# Patient Record
Sex: Female | Born: 1980 | Race: White | Hispanic: Yes | Marital: Married | Smoking: Former smoker
Health system: Southern US, Community
[De-identification: ages and names within clinical notes are randomized; demographics above are authoritative.]

## PROBLEM LIST (undated history)

## (undated) DIAGNOSIS — N939 Abnormal uterine and vaginal bleeding, unspecified: Secondary | ICD-10-CM

## (undated) DIAGNOSIS — M1990 Unspecified osteoarthritis, unspecified site: Secondary | ICD-10-CM

## (undated) DIAGNOSIS — K219 Gastro-esophageal reflux disease without esophagitis: Secondary | ICD-10-CM

## (undated) DIAGNOSIS — Z973 Presence of spectacles and contact lenses: Secondary | ICD-10-CM

---

## 2001-01-24 ENCOUNTER — Encounter: Payer: Self-pay | Admitting: Obstetrics

## 2001-01-24 ENCOUNTER — Inpatient Hospital Stay (HOSPITAL_COMMUNITY): Admission: AD | Admit: 2001-01-24 | Discharge: 2001-01-24 | Payer: Self-pay | Admitting: Obstetrics

## 2001-03-05 ENCOUNTER — Inpatient Hospital Stay (HOSPITAL_COMMUNITY): Admission: AD | Admit: 2001-03-05 | Discharge: 2001-03-05 | Payer: Self-pay | Admitting: Obstetrics

## 2001-05-28 ENCOUNTER — Inpatient Hospital Stay (HOSPITAL_COMMUNITY): Admission: AD | Admit: 2001-05-28 | Discharge: 2001-05-28 | Payer: Self-pay | Admitting: *Deleted

## 2001-05-28 ENCOUNTER — Encounter: Payer: Self-pay | Admitting: *Deleted

## 2001-07-30 ENCOUNTER — Inpatient Hospital Stay (HOSPITAL_COMMUNITY): Admission: AD | Admit: 2001-07-30 | Discharge: 2001-07-30 | Payer: Self-pay | Admitting: *Deleted

## 2001-09-13 ENCOUNTER — Inpatient Hospital Stay (HOSPITAL_COMMUNITY): Admission: AD | Admit: 2001-09-13 | Discharge: 2001-09-13 | Payer: Self-pay | Admitting: *Deleted

## 2001-09-14 ENCOUNTER — Inpatient Hospital Stay (HOSPITAL_COMMUNITY): Admission: AD | Admit: 2001-09-14 | Discharge: 2001-09-16 | Payer: Self-pay | Admitting: Obstetrics and Gynecology

## 2001-09-14 ENCOUNTER — Encounter (INDEPENDENT_AMBULATORY_CARE_PROVIDER_SITE_OTHER): Payer: Self-pay

## 2002-03-25 ENCOUNTER — Inpatient Hospital Stay (HOSPITAL_COMMUNITY): Admission: AD | Admit: 2002-03-25 | Discharge: 2002-03-25 | Payer: Self-pay | Admitting: *Deleted

## 2002-03-25 ENCOUNTER — Emergency Department (HOSPITAL_COMMUNITY): Admission: EM | Admit: 2002-03-25 | Discharge: 2002-03-25 | Payer: Self-pay | Admitting: Emergency Medicine

## 2002-03-28 ENCOUNTER — Inpatient Hospital Stay (HOSPITAL_COMMUNITY): Admission: AD | Admit: 2002-03-28 | Discharge: 2002-03-28 | Payer: Self-pay | Admitting: *Deleted

## 2002-05-10 ENCOUNTER — Inpatient Hospital Stay (HOSPITAL_COMMUNITY): Admission: AD | Admit: 2002-05-10 | Discharge: 2002-05-10 | Payer: Self-pay | Admitting: Family Medicine

## 2002-05-10 ENCOUNTER — Encounter: Payer: Self-pay | Admitting: Family Medicine

## 2002-05-23 ENCOUNTER — Encounter: Admission: RE | Admit: 2002-05-23 | Discharge: 2002-05-23 | Payer: Self-pay | Admitting: *Deleted

## 2002-05-23 ENCOUNTER — Other Ambulatory Visit: Admission: RE | Admit: 2002-05-23 | Discharge: 2002-05-23 | Payer: Self-pay | Admitting: Obstetrics and Gynecology

## 2002-09-17 ENCOUNTER — Inpatient Hospital Stay (HOSPITAL_COMMUNITY): Admission: AD | Admit: 2002-09-17 | Discharge: 2002-09-17 | Payer: Self-pay | Admitting: *Deleted

## 2002-11-25 ENCOUNTER — Inpatient Hospital Stay (HOSPITAL_COMMUNITY): Admission: AD | Admit: 2002-11-25 | Discharge: 2002-11-26 | Payer: Self-pay | Admitting: Obstetrics & Gynecology

## 2002-11-25 ENCOUNTER — Encounter: Payer: Self-pay | Admitting: Obstetrics & Gynecology

## 2003-03-19 ENCOUNTER — Inpatient Hospital Stay (HOSPITAL_COMMUNITY): Admission: AD | Admit: 2003-03-19 | Discharge: 2003-03-20 | Payer: Self-pay | Admitting: Obstetrics and Gynecology

## 2003-03-23 ENCOUNTER — Inpatient Hospital Stay (HOSPITAL_COMMUNITY): Admission: AD | Admit: 2003-03-23 | Discharge: 2003-03-24 | Payer: Self-pay | Admitting: *Deleted

## 2003-03-24 ENCOUNTER — Inpatient Hospital Stay (HOSPITAL_COMMUNITY): Admission: AD | Admit: 2003-03-24 | Discharge: 2003-03-26 | Payer: Self-pay | Admitting: Obstetrics and Gynecology

## 2004-04-21 ENCOUNTER — Emergency Department (HOSPITAL_COMMUNITY): Admission: EM | Admit: 2004-04-21 | Discharge: 2004-04-21 | Payer: Self-pay | Admitting: Family Medicine

## 2004-04-22 ENCOUNTER — Emergency Department (HOSPITAL_COMMUNITY): Admission: EM | Admit: 2004-04-22 | Discharge: 2004-04-23 | Payer: Self-pay | Admitting: Emergency Medicine

## 2004-04-23 ENCOUNTER — Inpatient Hospital Stay (HOSPITAL_COMMUNITY): Admission: AD | Admit: 2004-04-23 | Discharge: 2004-04-23 | Payer: Self-pay | Admitting: Obstetrics & Gynecology

## 2004-06-14 ENCOUNTER — Inpatient Hospital Stay (HOSPITAL_COMMUNITY): Admission: AD | Admit: 2004-06-14 | Discharge: 2004-06-14 | Payer: Self-pay | Admitting: Obstetrics and Gynecology

## 2004-06-16 ENCOUNTER — Emergency Department (HOSPITAL_COMMUNITY): Admission: EM | Admit: 2004-06-16 | Discharge: 2004-06-16 | Payer: Self-pay | Admitting: Emergency Medicine

## 2004-06-18 ENCOUNTER — Ambulatory Visit (HOSPITAL_COMMUNITY): Admission: AD | Admit: 2004-06-18 | Discharge: 2004-06-18 | Payer: Self-pay | Admitting: Obstetrics and Gynecology

## 2004-06-18 ENCOUNTER — Encounter (INDEPENDENT_AMBULATORY_CARE_PROVIDER_SITE_OTHER): Payer: Self-pay | Admitting: *Deleted

## 2005-03-04 ENCOUNTER — Inpatient Hospital Stay (HOSPITAL_COMMUNITY): Admission: AD | Admit: 2005-03-04 | Discharge: 2005-03-04 | Payer: Self-pay | Admitting: *Deleted

## 2005-04-20 ENCOUNTER — Inpatient Hospital Stay (HOSPITAL_COMMUNITY): Admission: AD | Admit: 2005-04-20 | Discharge: 2005-04-21 | Payer: Self-pay | Admitting: Obstetrics & Gynecology

## 2005-04-20 ENCOUNTER — Ambulatory Visit: Payer: Self-pay | Admitting: Obstetrics and Gynecology

## 2005-05-31 ENCOUNTER — Encounter: Payer: Self-pay | Admitting: Obstetrics & Gynecology

## 2005-05-31 ENCOUNTER — Ambulatory Visit: Payer: Self-pay | Admitting: Family Medicine

## 2005-06-16 ENCOUNTER — Ambulatory Visit: Payer: Self-pay | Admitting: Obstetrics and Gynecology

## 2005-06-16 ENCOUNTER — Inpatient Hospital Stay (HOSPITAL_COMMUNITY): Admission: AD | Admit: 2005-06-16 | Discharge: 2005-06-16 | Payer: Self-pay | Admitting: Obstetrics & Gynecology

## 2005-06-23 ENCOUNTER — Ambulatory Visit: Payer: Self-pay | Admitting: Certified Nurse Midwife

## 2005-06-23 ENCOUNTER — Inpatient Hospital Stay (HOSPITAL_COMMUNITY): Admission: AD | Admit: 2005-06-23 | Discharge: 2005-06-25 | Payer: Self-pay | Admitting: Gynecology

## 2006-02-18 ENCOUNTER — Emergency Department (HOSPITAL_COMMUNITY): Admission: EM | Admit: 2006-02-18 | Discharge: 2006-02-18 | Payer: Self-pay | Admitting: Family Medicine

## 2006-02-22 IMAGING — US US OB COMP +14 WK
1 series · 13 of 28 positions shown · non-contrast
Comparison: none

CLINICAL DATA: 23 week 4 day gestational age by LMP.  Right-sided abdominal pain and pressure.

[Series 1: us ob comp +14 wk · 0.29mm/px · 13 of 93 slices shown]
[im 4/93]
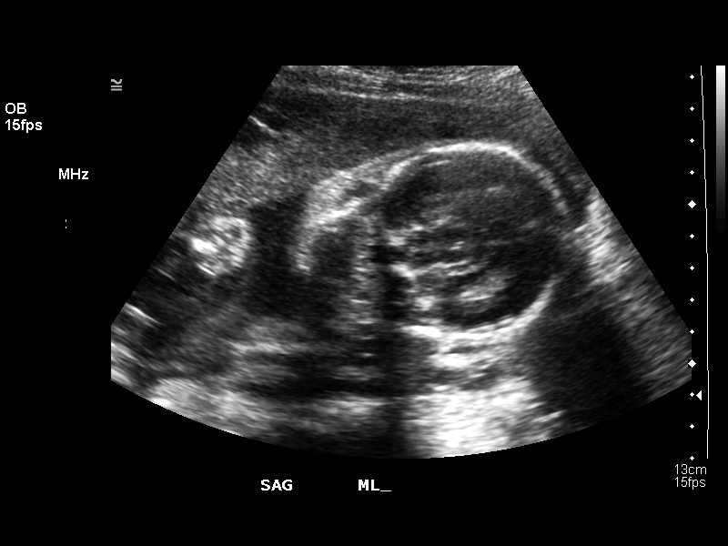
[im 11/93]
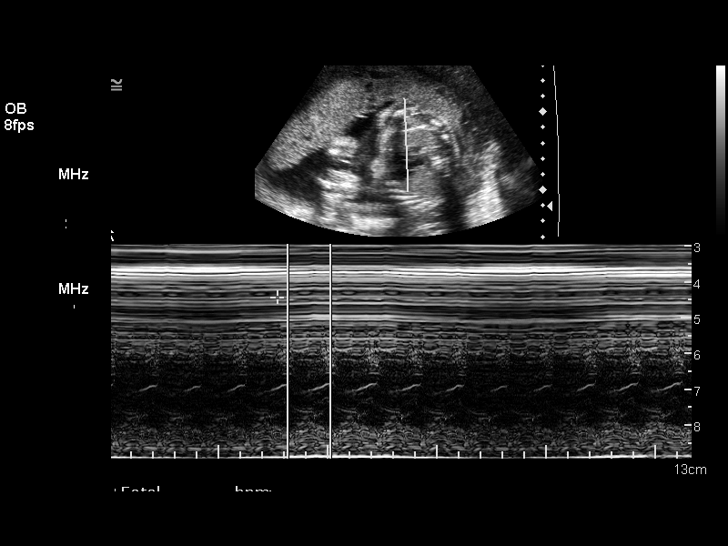
[im 18/93]
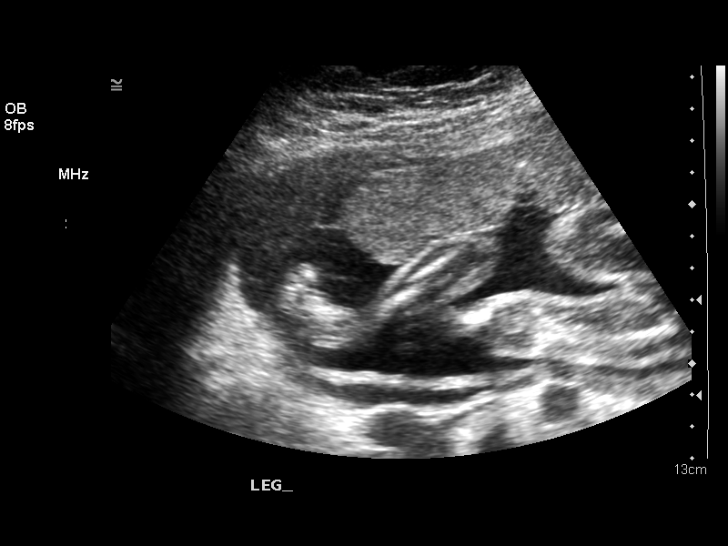
[im 24/93]
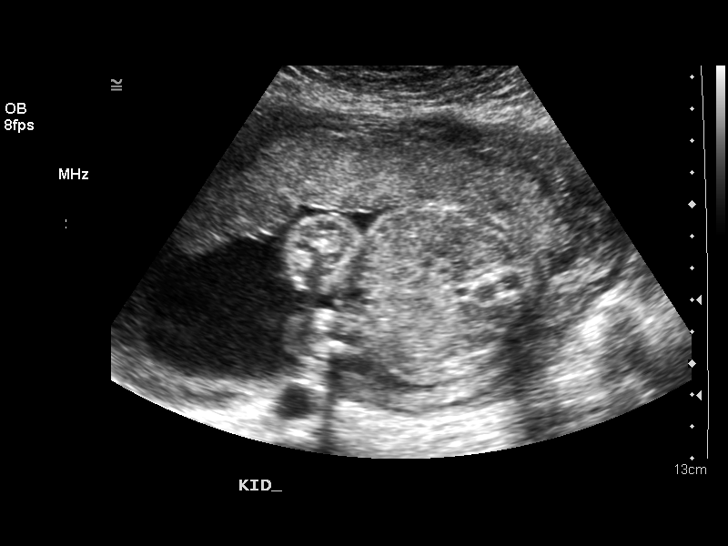
[im 31/93]
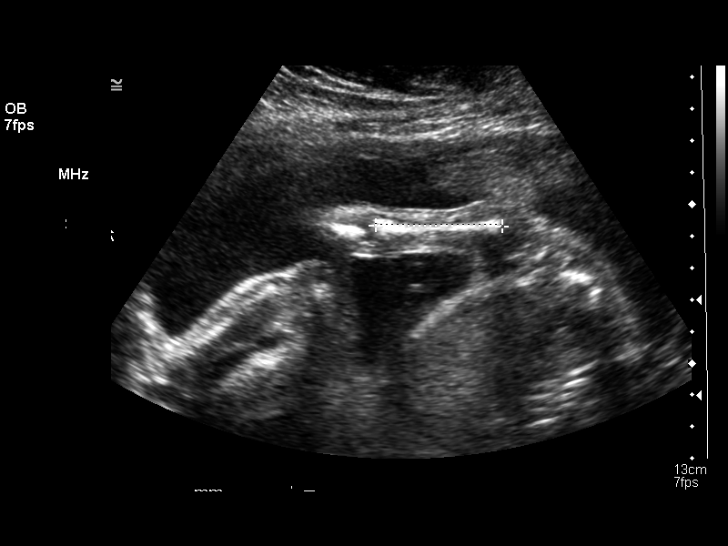
[im 38/93]
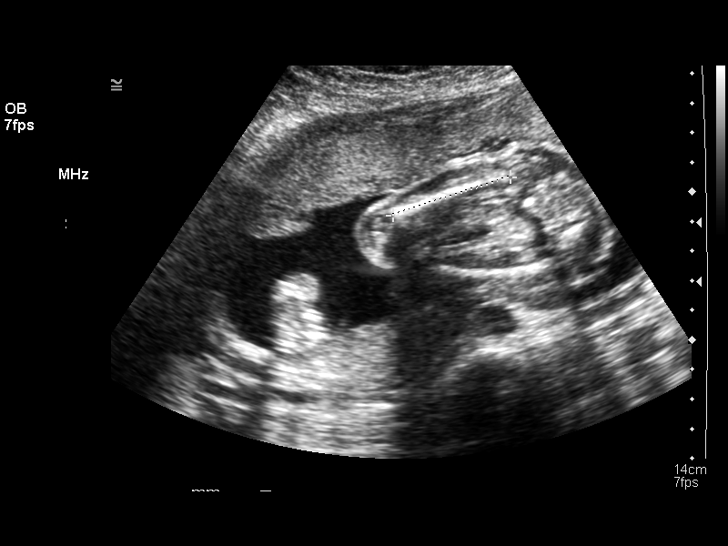
[im 48/93]
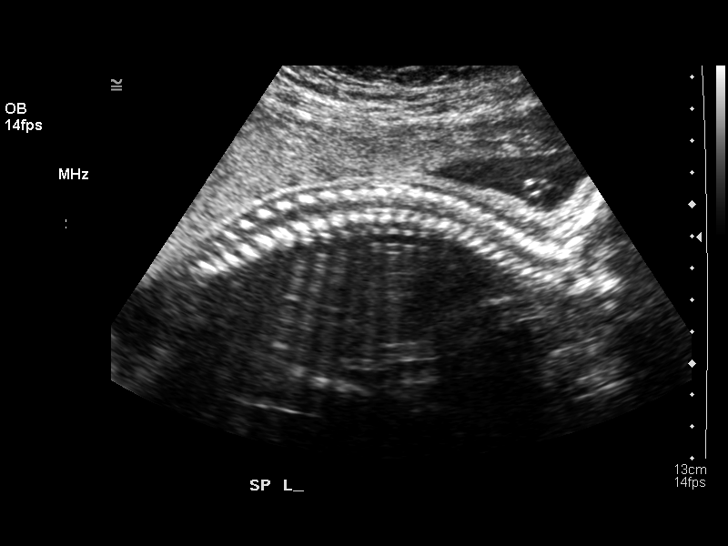
[im 55/93]
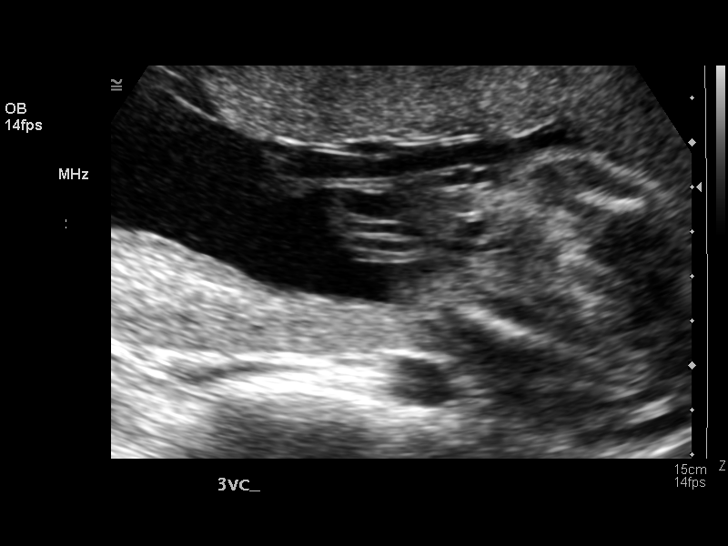
[im 62/93]
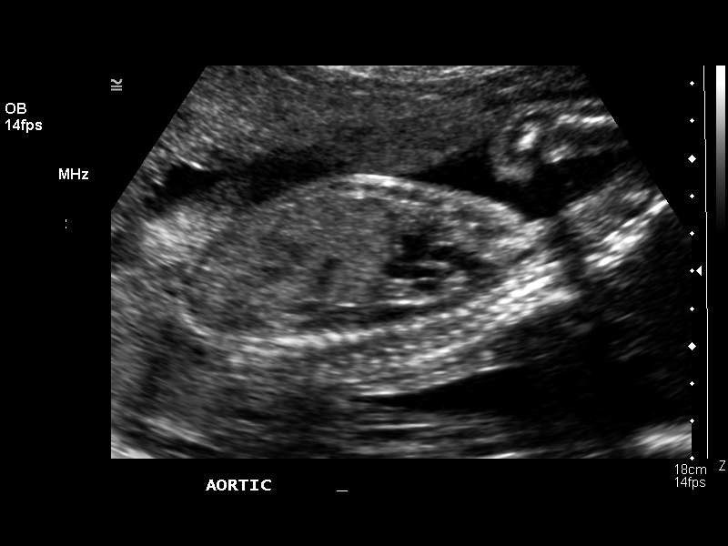
[im 69/93]
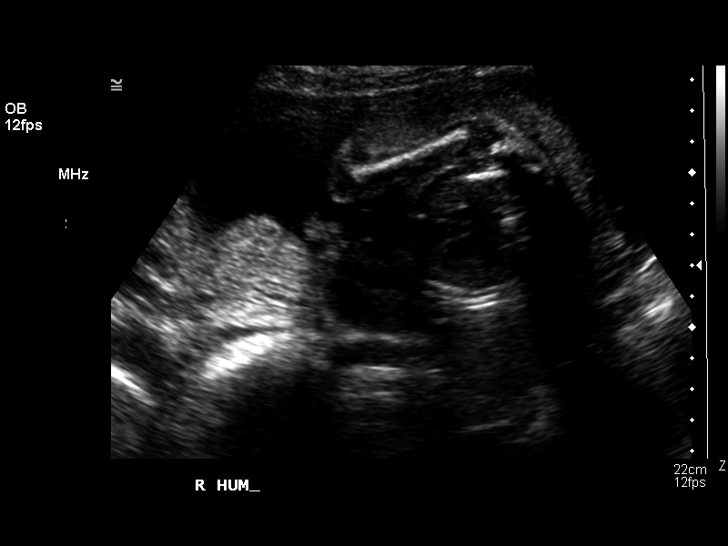
[im 75/93]
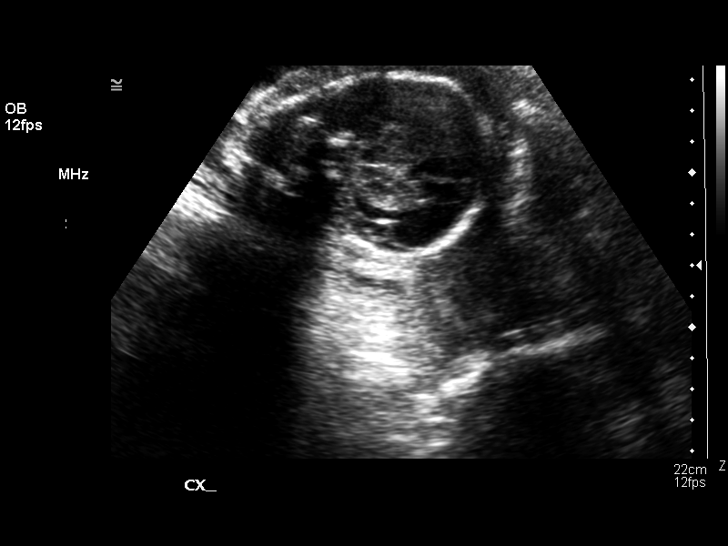
[im 82/93]
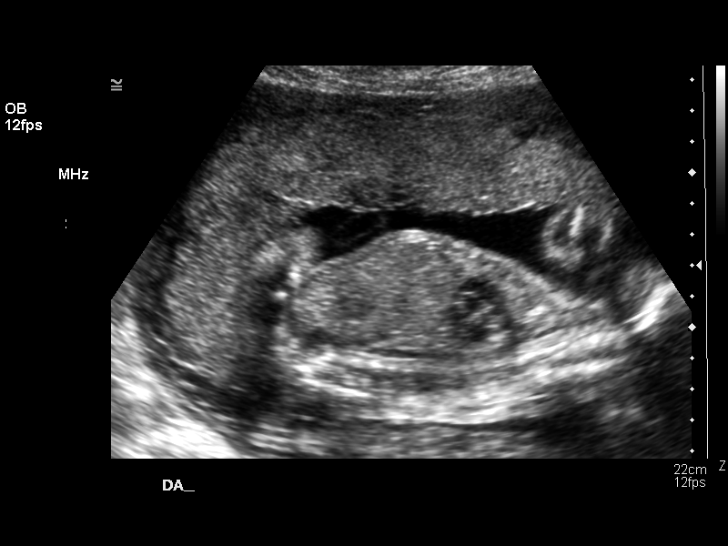
[im 89/93]
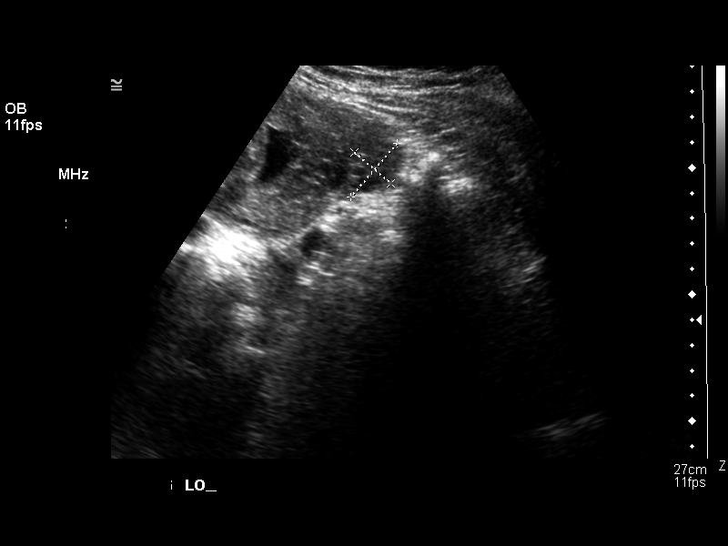

[13 of 28 positions shown; findings below may reference images not displayed]

OBSTETRICAL ULTRASOUND:
Number of Fetuses:  1
Heart Rate:  133
Movement:  Yes
Breathing:  No  
Presentation:  Cephalic
Placental Location:  Anterior
Grade:  I
Previa:  No
Amniotic Fluid (Subjective):  Normal
Amniotic Fluid (Objective):   3.8 cm Vertical pocket 

FETAL BIOMETRY
BPD:  6.1 cm   24 w 6 d
HC:  22.8 cm  24 w 6 d
AC:  18.9 cm   23 w 5 d
FL:    4.4 cm   24 w 2 d

MEAN GA:  24 w 3 d  

FETAL ANATOMY
Lateral Ventricles:    Visualized 
Thalami/CSP:      Visualized 
Posterior Fossa:  Visualized 
Nuchal Region:    N/A
Spine:      Visualized 
4 Chamber Heart on Left:      Visualized 
Stomach on Left:      Visualized 
3 Vessel Cord:    Visualized 
Cord Insertion site:    Visualized 
Kidneys:  Visualized 
Bladder:  Visualized 
Extremities:      Visualized 

ADDITIONAL ANATOMY VISUALIZED:  LVOT, RVOT, upper lip, orbits, profile, diaphragm, heel, ductal arch, and aortic arch.

MATERNAL UTERINE AND ADNEXAL FINDINGS
Cervix: 3.3 cm Transabdominally.  Both ovaries are visualized and are unremarkable in appearance.
IMPRESSION: 1.  Single living intrauterine fetus with mean gestational age of 24 weeks and 3 days and sonographic EDC of 06/21/05.  This is within one week of LMP dating.
2.  No evidence of fetal anatomic abnormality.  
3.  No maternal uterine or adnexal abnormality identified.

## 2006-03-25 ENCOUNTER — Inpatient Hospital Stay (HOSPITAL_COMMUNITY): Admission: AD | Admit: 2006-03-25 | Discharge: 2006-03-25 | Payer: Self-pay | Admitting: Family Medicine

## 2006-12-21 ENCOUNTER — Ambulatory Visit: Payer: Self-pay | Admitting: Obstetrics and Gynecology

## 2006-12-21 ENCOUNTER — Inpatient Hospital Stay (HOSPITAL_COMMUNITY): Admission: AD | Admit: 2006-12-21 | Discharge: 2006-12-21 | Payer: Self-pay | Admitting: Obstetrics and Gynecology

## 2007-01-03 ENCOUNTER — Ambulatory Visit: Payer: Self-pay | Admitting: Obstetrics & Gynecology

## 2007-01-03 ENCOUNTER — Encounter: Payer: Self-pay | Admitting: Physician Assistant

## 2007-01-17 ENCOUNTER — Ambulatory Visit: Payer: Self-pay | Admitting: *Deleted

## 2007-01-31 ENCOUNTER — Ambulatory Visit: Payer: Self-pay | Admitting: *Deleted

## 2007-02-07 ENCOUNTER — Ambulatory Visit: Payer: Self-pay | Admitting: Obstetrics & Gynecology

## 2007-02-14 ENCOUNTER — Ambulatory Visit: Payer: Self-pay | Admitting: Obstetrics & Gynecology

## 2007-02-21 ENCOUNTER — Ambulatory Visit: Payer: Self-pay | Admitting: *Deleted

## 2007-02-28 ENCOUNTER — Ambulatory Visit: Payer: Self-pay | Admitting: *Deleted

## 2007-03-05 ENCOUNTER — Ambulatory Visit: Payer: Self-pay | Admitting: Obstetrics and Gynecology

## 2007-03-07 ENCOUNTER — Ambulatory Visit: Payer: Self-pay | Admitting: Obstetrics & Gynecology

## 2007-03-09 ENCOUNTER — Ambulatory Visit: Payer: Self-pay | Admitting: Physician Assistant

## 2007-03-09 ENCOUNTER — Inpatient Hospital Stay (HOSPITAL_COMMUNITY): Admission: RE | Admit: 2007-03-09 | Discharge: 2007-03-11 | Payer: Self-pay | Admitting: Family Medicine

## 2007-10-23 ENCOUNTER — Emergency Department (HOSPITAL_COMMUNITY): Admission: EM | Admit: 2007-10-23 | Discharge: 2007-10-23 | Payer: Self-pay | Admitting: Family Medicine

## 2008-04-04 ENCOUNTER — Emergency Department (HOSPITAL_COMMUNITY): Admission: EM | Admit: 2008-04-04 | Discharge: 2008-04-04 | Payer: Self-pay | Admitting: Emergency Medicine

## 2008-04-20 ENCOUNTER — Emergency Department (HOSPITAL_COMMUNITY): Admission: EM | Admit: 2008-04-20 | Discharge: 2008-04-20 | Payer: Self-pay | Admitting: Emergency Medicine

## 2010-09-03 NOTE — H&P (Signed)
NAME:  Marissa [name], SOLAR NO.:  [id number]   MEDICAL RECORD NO.:  [id number]          PATIENT TYPE:  MAT   LOCATION:  [facility]                          FACILITY:  [facility]   PHYSICIAN:  Charles A. Delcambre, MDDATE OF BIRTH:  February 18, 1981   DATE OF ADMISSION:  06/14/2004  DATE OF DISCHARGE:                                HISTORY & PHYSICAL   HISTORY OF PRESENT ILLNESS:  This patient to be admitted on June 18, 2004 to  undergo D&E for a 9-week fetal demise. She presented with LMP ? December  2005 with ultrasound on June 14, 2004 showing 9-week 1-day by verbal  report fetal demise, quantitative hCG 1606, Rh positive pregnancy. She is a  30 year old gravida 3 para 2-0-0-2. She has light vaginal bleeding at this  time, seen today in further follow-up.   PAST MEDICAL HISTORY:  None.   SURGICAL HISTORY:  None. SVD x2.   MEDICATIONS:  None.   ALLERGIES:  No known drug allergies.   SOCIAL HISTORY:  No tobacco, ethanol, or drug use. The patient is married in  a monogamous relationship with her husband.   FAMILY HISTORY:  Mother with asthma; otherwise, no significant medical  illnesses. Two siblings otherwise alive and well.   REVIEW OF SYSTEMS:  No chest pain, fever, chills, nausea, vomiting,  diarrhea, constipation, urgency, frequency, dysuria. Some cramping, light  vaginal bleeding at this time today. No passage of tissue, heavy cramping,  or heavy bleeding.   PHYSICAL EXAMINATION:  GENERAL:  Alert and oriented x3.  VITAL SIGNS:  Blood pressure 110/70, heart rate 58, respirations 16, weight  164 pounds.  HEENT:  Grossly within normal limits.  NECK:  Supple without thyromegaly or adenopathy.  LUNGS:  Clear bilaterally.  HEART:  Regular rate and rhythm without murmur, rub, or gallop.  BREASTS:  Symmetrical. Otherwise, exam deferred.  ABDOMEN:  Soft, flat, and nontender. No hepatosplenomegaly or masses noted.  PELVIC:  Normal external female genitalia. Vault with a small  amount of dark  blood present. Multiparous cervix. No cervical motion tenderness. Uterus 8-  10 weeks size, mid plane, nontender. Adnexa nontender without masses  bilaterally.   ASSESSMENT:  Nine-week fetal demise.   PLAN:  Offered expectant management versus D&E. I feel this is too far for  Cytotec, that I am not comfortable with Cytotec management at this point.  The patient elects D&E. She accepts risks of infection, bleeding, bowel and  bladder damage, uterine perforation risks, and blood product risks including  hepatitis and HIV exposure, risk of second D&E for retained products. All  questions were answered, she gives informed consent. N.p.o. past midnight,  preoperative CBC. We will proceed as outlined this Friday.      CAD/MEDQ  D:  06/15/2004  T:  06/15/2004  Job:  [id number]

## 2010-09-03 NOTE — Op Note (Signed)
NAME:  Marissa [name], Marissa [name] NO.:  [id number]   MEDICAL RECORD NO.:  [id number]          PATIENT TYPE:  EMS   LOCATION:  [facility]                         FACILITY:  [facility]   PHYSICIAN:  Charles A. Delcambre, MDDATE OF BIRTH:  October 02, 1980   DATE OF PROCEDURE:  06/18/2004  DATE OF DISCHARGE:                                 OPERATIVE REPORT   PREOPERATIVE DIAGNOSIS:  Nine week inevitable abortion/incomplete abortion.   POSTOPERATIVE DIAGNOSIS:  Nine week inevitable abortion/incomplete abortion.   PROCEDURES:  1.  Dilation and evacuation.  2.  Paracervical block.   SURGEON:  Charles A. Delcambre, MD   ASSISTANT:  None.   COMPLICATIONS:  None.   ESTIMATED BLOOD LOSS:  100 mL with the procedure itself.   ANESTHESIA:  Monitored anesthesia care with IV sedation.   SPECIMENS:  Products of conception to pathology.   DESCRIPTION OF PROCEDURE:  The patient was taken to the operating room and  placed in the supine position.  Anesthesia and sedation was then undertaken.  She was then placed in the dorsal lithotomy position and sterile prep and  drape was undertaken.  A specimen was placed in the vagina.  A single-tooth  tenaculum was placed on the anterior lip of the cervix.  A paracervical  block with 0.25% plain Marcaine was placed at 4 and 8 o'clock.  Twenty  milliliters was divided of 0.25% plain Marcaine equally and no evidence of  intravascular location was noted.  Ring forceps were used to go through the  open cervix.  Some tissue was removed with the ring forceps, almost to an  intact sac.  This was done, gently teasing the tissue through the os.  After  removal of this tissue, a banjo curette was then used to curette the small  amount of tissue, a suction curette 9 mm curved was used set on 50 mmHg and  no further tissue was aspirated.  There was no uterine perforation with  gentle banjo curetting further, and the procedure was terminated after  Pitocin 10 IV was given,  Methergine 0.2 mg IM was given one, and 600 mcg of  Cytotec was given per rectum.  This achieved adequate hemostasis.  The  patient was taken to recovery with the physician in attendance after all  instruments were removed, having tolerated the procedure well.    CAD/MEDQ  D:  06/18/2004  T:  06/18/2004  Job:  [id number]

## 2011-01-25 LAB — POCT URINALYSIS DIP (DEVICE)
Bilirubin Urine: NEGATIVE
Glucose, UA: NEGATIVE
Glucose, UA: NEGATIVE
Hgb urine dipstick: NEGATIVE
Hgb urine dipstick: NEGATIVE
Hgb urine dipstick: NEGATIVE
Ketones, ur: NEGATIVE
Nitrite: NEGATIVE
Operator id: 148111
Protein, ur: NEGATIVE
Protein, ur: NEGATIVE
Urobilinogen, UA: 0.2
pH: 6.5
pH: 7

## 2011-01-25 LAB — CBC
MCV: 81.4
Platelets: 212
RBC: 4.09
WBC: 5.9

## 2011-01-25 LAB — RPR: RPR Ser Ql: NONREACTIVE

## 2011-01-25 LAB — CCBB MATERNAL DONOR DRAW

## 2011-01-26 LAB — POCT URINALYSIS DIP (DEVICE)
Bilirubin Urine: NEGATIVE
Glucose, UA: 100 — AB
Glucose, UA: NEGATIVE
Glucose, UA: NEGATIVE
Hgb urine dipstick: NEGATIVE
Ketones, ur: NEGATIVE
Nitrite: NEGATIVE
Operator id: 148111
Protein, ur: NEGATIVE
Urobilinogen, UA: 2 — ABNORMAL HIGH
pH: 7
pH: 8.5 — ABNORMAL HIGH

## 2011-01-27 LAB — POCT URINALYSIS DIP (DEVICE)
Ketones, ur: NEGATIVE
Nitrite: NEGATIVE
Protein, ur: NEGATIVE
Specific Gravity, Urine: >=1.03
pH: 6

## 2011-01-28 LAB — URINALYSIS, ROUTINE W REFLEX MICROSCOPIC
Nitrite: NEGATIVE
Specific Gravity, Urine: <1.005 — ABNORMAL LOW
pH: 6.5

## 2011-01-28 LAB — DIFFERENTIAL
Basophils Relative: 0
Eosinophils Relative: 1
Neutrophils Relative %: 49

## 2011-01-28 LAB — GC/CHLAMYDIA PROBE AMP, GENITAL
Chlamydia, DNA Probe: NEGATIVE
GC Probe Amp, Genital: NEGATIVE

## 2011-01-28 LAB — WET PREP, GENITAL
Trich, Wet Prep: NONE SEEN
Yeast Wet Prep HPF POC: NONE SEEN

## 2011-01-28 LAB — RPR: RPR Ser Ql: NONREACTIVE

## 2011-01-28 LAB — CBC
HCT: 33.5 — ABNORMAL LOW
Platelets: 256
RDW: 14
WBC: 8.3

## 2011-04-19 HISTORY — PX: STERILIZATION: SHX533

## 2013-05-02 ENCOUNTER — Ambulatory Visit: Payer: Self-pay

## 2013-05-13 ENCOUNTER — Ambulatory Visit: Payer: No Typology Code available for payment source | Attending: Internal Medicine

## 2013-06-24 ENCOUNTER — Ambulatory Visit: Payer: Self-pay

## 2013-08-15 ENCOUNTER — Ambulatory Visit: Payer: Self-pay

## 2013-09-24 ENCOUNTER — Ambulatory Visit (INDEPENDENT_AMBULATORY_CARE_PROVIDER_SITE_OTHER): Payer: No Typology Code available for payment source | Admitting: Family Medicine

## 2013-09-24 ENCOUNTER — Encounter: Payer: Self-pay | Admitting: Family Medicine

## 2013-09-24 VITALS — BP 133/75 | HR 60 | Temp 98.2°F | Resp 16 | Ht 62.0 in | Wt 177.0 lb

## 2013-09-24 DIAGNOSIS — N912 Amenorrhea, unspecified: Secondary | ICD-10-CM

## 2013-09-24 DIAGNOSIS — R635 Abnormal weight gain: Secondary | ICD-10-CM

## 2013-09-24 DIAGNOSIS — R1032 Left lower quadrant pain: Secondary | ICD-10-CM

## 2013-09-24 DIAGNOSIS — K5900 Constipation, unspecified: Secondary | ICD-10-CM

## 2013-09-24 LAB — COMPLETE METABOLIC PANEL WITH GFR
ALBUMIN: 4.3 g/dL (ref 3.5–5.2)
ALK PHOS: 57 U/L (ref 39–117)
ALT: 22 U/L (ref 0–35)
AST: 21 U/L (ref 0–37)
BUN: 8 mg/dL (ref 6–23)
CALCIUM: 9.5 mg/dL (ref 8.4–10.5)
CO2: 27 mEq/L (ref 19–32)
Chloride: 102 mEq/L (ref 96–112)
Creat: 0.57 mg/dL (ref 0.50–1.10)
GFR, Est African American: >89 mL/min
GFR, Est Non African American: >89 mL/min
Glucose, Bld: 86 mg/dL (ref 70–99)
Potassium: 4.1 mEq/L (ref 3.5–5.3)
SODIUM: 137 meq/L (ref 135–145)
TOTAL PROTEIN: 7.8 g/dL (ref 6.0–8.3)
Total Bilirubin: 0.4 mg/dL (ref 0.2–1.2)

## 2013-09-24 LAB — CBC WITH DIFFERENTIAL/PLATELET
BASOS ABS: 0.1 10*3/uL (ref 0.0–0.1)
Basophils Relative: 1 % (ref 0–1)
Eosinophils Absolute: 0.2 10*3/uL (ref 0.0–0.7)
Eosinophils Relative: 3 % (ref 0–5)
HCT: 39.1 % (ref 36.0–46.0)
HEMOGLOBIN: 13.2 g/dL (ref 12.0–15.0)
Lymphocytes Relative: 52 % — ABNORMAL HIGH (ref 12–46)
Lymphs Abs: 3.1 10*3/uL (ref 0.7–4.0)
MCH: 27 pg (ref 26.0–34.0)
MCHC: 33.8 g/dL (ref 30.0–36.0)
MCV: 80 fL (ref 78.0–100.0)
Monocytes Absolute: 0.4 10*3/uL (ref 0.1–1.0)
Monocytes Relative: 7 % (ref 3–12)
NEUTROS ABS: 2.2 10*3/uL (ref 1.7–7.7)
NEUTROS PCT: 37 % — AB (ref 43–77)
PLATELETS: 295 10*3/uL (ref 150–400)
RBC: 4.89 MIL/uL (ref 3.87–5.11)
RDW: 14.4 % (ref 11.5–15.5)
WBC: 6 10*3/uL (ref 4.0–10.5)

## 2013-09-24 NOTE — Patient Instructions (Signed)
Will notify patient with laboratory results.  Start 1800 lowfat/low calorie diet divided over 6 small meals Increase water intake to 6-8 glasses per day Patient to start exercise regimen 3 days per week for 30 minutes.  Given written materials in spanish pertaining to low calorie diet.

## 2013-09-24 NOTE — Progress Notes (Signed)
Subjective:    Patient ID: Marissa [name], female    DOB: [birth year], 33 y.o.   MRN: [id number]  HPI  Patient presents to establish care accompanied by spanish interpreter. Patient primarily speaks spanish, yet understands some English per interpreter.  Reports that she has not had a primary physician since relocating from [place]. States that her  last physical examination was in 2013 in [place].   Patient complaining of pain to to left lower abdomen. States that pain started 3 months ago. She thought that the pain was related to the start of her menstrual period. However, her menstrual period never came. She maintains that her cycles were regular prior to 3 months ago. She states that she had a tubal ligation in 2013, so she has not required birth control. Describes abdominal discomfort as intermittent and aching. Patient has not attempted any OTC interventions to alleviate discomfort.      Review of Systems  Constitutional: Positive for fatigue.  HENT: Negative.   Eyes: Negative.   Cardiovascular: Negative.   Gastrointestinal: Positive for nausea and constipation (Tea from [employer]/aloe based tea).  Endocrine: Negative.   Skin: Negative.   Allergic/Immunologic: Negative.   Neurological: Negative.   Hematological: Negative.   Psychiatric/Behavioral: Negative.        Objective:   Physical Exam  Vitals reviewed. Constitutional: She is oriented to person, place, and time. She appears well-developed and well-nourished.  HENT:  Head: Normocephalic and atraumatic.  Right Ear: External ear normal.  Eyes: Conjunctivae are normal. Pupils are equal, round, and reactive to light.  Neck: Normal range of motion. Neck supple. Tracheal deviation: .  Cardiovascular: Normal rate, regular rhythm and normal heart sounds.   Pulmonary/Chest: Effort normal and breath sounds normal.  Abdominal: Soft. Bowel sounds are normal. There is tenderness in the left lower quadrant.     Musculoskeletal: Normal range of motion.  Neurological: She is alert and oriented to person, place, and time.  Skin: Skin is warm and dry.  Psychiatric: She has a normal mood and affect. Her behavior is normal.   BP 133/75  Pulse 60  Temp(Src) 98.2 F (36.8 C) (Oral)  Resp 16  Ht 5\' 2"  (1.575 m)  Wt 177 lb (80.287 kg)  BMI 32.37 kg/m2  SpO2 100%  LMP 06/26/2013        Assessment & Plan:   1. Lower abdominal pain:  Patient reports periodic abdominal pain. Denies nausea, vomiting, yet reports occasional constipation. Recommend that patient start a lowfat, high fiber diet divided over 6 small meals, increase water intake to 6-8 glasses. Will check CBC, CMP  2. Amenorrhea: Patient reports that she has not had a menstrual period for 3 months. Patient had a tubal ligation 3 years ago. Patient is gravida 5, para 4. States that menstrual cycle was normal until 3 months ago. Will check urine pregnancy.   3. Weight gain- Start 1800 lowfat/low calorie diet divided over 6 small meal. Increase water intake to 6-8 glasses per day. Patient to start exercise regimen 3 days per week for 30 minutes. Given written materials in spanish pertaining to low calorie diet. Check HbA1C   4. Constipation: Start lowfat, high fiber diet and increase water intake.  Increase daily activity.   Immunizations: TDap 4 years ago. Patient reports that she is up to date with vaccinations. [place] all vacc[place]  RTC: 2 months with Dr. Ashley RoyaltyMatthews CPE with pap smear Labs: Hgb A1C, CBCw/ diff, CMP, urine pregnancy Gynecological: Last pap  smear was in 2013 during physical exam in [place]. Patient reports that pap was negative. Reports that she was told she had a cyst on her cervix.  Will repeat pap smear during CPE  Massie Maroon, FNP

## 2013-09-25 LAB — URINALYSIS, COMPLETE
BILIRUBIN URINE: NEGATIVE
Bacteria, UA: NONE SEEN
Casts: NONE SEEN
Crystals: NONE SEEN
GLUCOSE, UA: NEGATIVE mg/dL
HGB URINE DIPSTICK: NEGATIVE
KETONES UR: NEGATIVE mg/dL
LEUKOCYTES UA: NEGATIVE
Nitrite: NEGATIVE
PH: 7.5 (ref 5.0–8.0)
Protein, ur: NEGATIVE mg/dL
SPECIFIC GRAVITY, URINE: 1.012 (ref 1.005–1.030)
Squamous Epithelial / LPF: NONE SEEN
UROBILINOGEN UA: 0.2 mg/dL (ref 0.0–1.0)

## 2013-09-25 LAB — HEMOGLOBIN A1C
Hgb A1c MFr Bld: 5.4 % (ref <5.7)
MEAN PLASMA GLUCOSE: 108 mg/dL (ref <117)

## 2013-09-25 LAB — TSH: TSH: 3.971 u[IU]/mL (ref 0.350–4.500)

## 2013-09-25 LAB — HCG, SERUM, QUALITATIVE: Preg, Serum: NEGATIVE

## 2013-09-26 DIAGNOSIS — R1032 Left lower quadrant pain: Secondary | ICD-10-CM | POA: Insufficient documentation

## 2013-09-26 DIAGNOSIS — N912 Amenorrhea, unspecified: Secondary | ICD-10-CM | POA: Insufficient documentation

## 2013-09-26 DIAGNOSIS — R635 Abnormal weight gain: Secondary | ICD-10-CM | POA: Insufficient documentation

## 2013-11-25 ENCOUNTER — Encounter: Payer: No Typology Code available for payment source | Admitting: Internal Medicine

## 2014-08-05 ENCOUNTER — Emergency Department (INDEPENDENT_AMBULATORY_CARE_PROVIDER_SITE_OTHER)
Admission: EM | Admit: 2014-08-05 | Discharge: 2014-08-05 | Disposition: A | Discharge status: Home / Self Care | Payer: Self-pay | Source: Home / Self Care | Attending: Emergency Medicine | Admitting: Emergency Medicine

## 2014-08-05 ENCOUNTER — Other Ambulatory Visit (HOSPITAL_COMMUNITY)
Admission: RE | Admit: 2014-08-05 | Discharge: 2014-08-05 | Disposition: A | Discharge status: Home / Self Care | Payer: Self-pay | Source: Ambulatory Visit | Attending: Emergency Medicine | Admitting: Emergency Medicine

## 2014-08-05 ENCOUNTER — Encounter (HOSPITAL_COMMUNITY): Payer: Self-pay | Admitting: Emergency Medicine

## 2014-08-05 DIAGNOSIS — N760 Acute vaginitis: Secondary | ICD-10-CM | POA: Insufficient documentation

## 2014-08-05 DIAGNOSIS — Z113 Encounter for screening for infections with a predominantly sexual mode of transmission: Secondary | ICD-10-CM | POA: Insufficient documentation

## 2014-08-05 LAB — POCT PREGNANCY, URINE: PREG TEST UR: NEGATIVE

## 2014-08-05 MED ORDER — FLUCONAZOLE 150 MG PO TABS: 150.0000 mg | ORAL_TABLET | Freq: Every day | ORAL | Status: DC

## 2014-08-05 NOTE — ED Notes (Signed)
Vaginal itching, discharge.  Reports Saturday os onset of symptoms.

## 2014-08-05 NOTE — ED Provider Notes (Signed)
CSN: [id number]     Arrival date & time 08/05/14  1448 History   First MD Initiated Contact with Patient 08/05/14 1618     Chief Complaint  Patient presents with  . Vaginal Discharge   (Consider location/radiation/quality/duration/timing/severity/associated sxs/prior Treatment) HPI Comments: Denies concerns for STIs No DM  Patient is a 34 y.o. female presenting with vaginal discharge. The history is provided by the patient.  Vaginal Discharge Quality:  Thick and white Severity:  Moderate Onset quality:  Gradual Duration:  4 days Progression:  Unchanged Chronicity:  New Associated symptoms: vaginal itching   Risk factors: no STI exposure     History reviewed. No pertinent past medical history. Past Surgical History  Procedure Laterality Date  . Sterilization  2013   Family History  Problem Relation Age of Onset  . Asthma Mother   . Hyperlipidemia Mother   . Cancer Father    History  Substance Use Topics  . Smoking status: Former Games developermoker  . Smokeless tobacco: Never Used     Comment: smoked for 1 year over 3 years ago  . Alcohol Use: No   OB History    No data available     Review of Systems  Genitourinary: Positive for vaginal discharge.  All other systems reviewed and are negative.   Allergies  Review of patient's allergies indicates no known allergies.  Home Medications   Prior to Admission medications   Medication Sig Start Date End Date Taking? Authorizing Provider  fluconazole (DIFLUCAN) 150 MG tablet Take 1 tablet (150 mg total) by mouth daily. 08/05/14   Jess BartersJennifer Lee H Lamesha Tibbits, PA   BP 107/68 mmHg  Pulse 62  Temp(Src) 98.9 F (37.2 C) (Oral)  Resp 12  SpO2 100%  LMP 07/05/2014 Physical Exam  Constitutional: She is oriented to person, place, and time. She appears well-developed and well-nourished. No distress.  HENT:  Head: Normocephalic and atraumatic.  Cardiovascular: Normal rate.   Pulmonary/Chest: Effort normal.  Genitourinary: Uterus  normal. Pelvic exam was performed with patient supine. There is no rash, tenderness or lesion on the right labia. There is no rash, tenderness or lesion on the left labia. Cervix exhibits discharge. Cervix exhibits no motion tenderness and no friability. Right adnexum displays no mass, no tenderness and no fullness. Left adnexum displays no mass, no tenderness and no fullness. No erythema, tenderness or bleeding in the vagina. No foreign body around the vagina. No signs of injury around the vagina. Vaginal discharge found.  +copious thick white adherent vaginal discharge  Musculoskeletal: Normal range of motion.  Neurological: She is alert and oriented to person, place, and time.  Skin: Skin is warm and dry.  Psychiatric: She has a normal mood and affect. Her behavior is normal.  Nursing note and vitals reviewed.   ED Course  Procedures (including critical care time) Labs Review Labs Reviewed  POCT PREGNANCY, URINE  CERVICOVAGINAL ANCILLARY ONLY    Imaging Review No results found.   MDM   1. Vaginitis    Cervicovaginal testing pend Exam suggestive of yeast vaginitis Will treat with diflucan and advise gyn follow up if no improvement   Ria ClockJennifer Lee H Gabrille Kilbride, PA 08/05/14 1704

## 2014-08-05 NOTE — Discharge Instructions (Signed)
Vaginitis  (Vaginitis)  La vaginitis es la inflamacin de la vagina. Generalmente se debe a un cambio en el equilibrio normal de las bacterias y hongos que viven en la vagina. Este cambio en el equilibrio causa un crecimiento excesivo de ciertas bacterias y hongos, lo que causa la inflamacin. Hay diferentes tipos de vaginitis, pero los ms comunes son:   Vaginitis bacteriana.  Infeccin por hongos (candidiasis).  Vaginitis por tricomoniasis. Esta es una enfermedad de transmisin sexual (ETS).  Vaginitis viral.  Vaginitis atrfica.  Vaginitis alrgica. CAUSAS  El tratamiento depende del tipo de vaginitis. Las causas pueden ser:   Bacterias (vaginitis bacteriana).  Hongos (infeccin por hongos).  Parsitos (vaginitis por tricomoniasis).  Virus (vaginitis viral).  Niveles hormonales bajos (vaginitis atrfica). Los niveles bajos de hormonas pueden ocurrir durante el embarazo, la lactancia o despus de la menopausia.  Irritantes, como los baos de espuma, los tampones perfumados y los aerosoles femeninos (vaginitis alrgica). Otros factores pueden alterar el equilibrio normal de los hongos y las bacterias que viven en la vagina. Ellos son:   Antibiticos.  Higiene personal deficiente.  Diafragmas, esponjas vaginales, espermicidas, pldoras anticonceptivas y dispositivos intrauterinos (DIU).  Las relaciones sexuales.  Infecciones.  La diabetes no controlada.  Tener un sistema inmunolgico debilitado. SNTOMAS  Los sntomas pueden variar segn la causa de la vaginitis. Los sntomas ms comunes son:   Flujo vaginal anormal.  La secrecin es de color blanco, gris o amarillento en la vaginitis bacteriana.  La secrecin es espesa, blanca y con apariencia de queso en la infeccin por hongos.  La secrecin es espumosa y de color amarillo o verdoso en la tricomoniasis.  Mal olor vaginal.  En la vaginitis bacteriana puede haber olor a "pescado".  Picazn, dolor o  hinchazn vaginal.  Relaciones sexuales dolorosas.  Dolor o ardor al orinar. En ocasiones puede no haber sntomas.  TRATAMIENTO  El tratamiento depende de la gravedad de la lesin.   La vaginitis bacteriana y la tricomoniasis a menudo se tratan con cremas o comprimidos antibiticos.  Las infecciones por hongos se tratan con medicamentos antifngicos, como cremas o supositorios vaginales.  La vaginitis viral no tiene cura, pero los sntomas pueden tratarse con medicamentos que alivian las molestias. Su pareja sexual tambin debe ser tratarse.  La vaginitis atrfica puede tratarse con crema, comprimidos, supositorios, o anillo vaginal con estrgenos. Si tiene sequedad vaginal, los lubricantes y las cremas hidratantes pueden ayudarla. Posiblemente le pedirn que evite los jabones, aerosoles o duchas perfumados.  El tratamiento de la vaginitis alrgica implica renunciar al uso del producto que est causando el problema. Las cremas vaginales pueden usarse para tratar los sntomas. INSTRUCCIONES PARA EL CUIDADO EN EL HOGAR   Tome todos los medicamentos segn le indic su mdico.  Mantenga la zona vaginal limpia y seca. Evite el jabn y slo enjuague el rea con agua.  Evite la ducha vaginal. Puede eliminar las bacterias saludables que hay en la vagina.  No utilice tampones ni tenga relaciones sexuales hasta que el profesional la autorice. No use apsitos mientras tenga vaginitis.  Higiencese de adelante hacia atrs. Esto evita la propagacin de bacterias desde el recto hacia la vagina.  Deje que el aire llegue a su rea genital.  Use ropa interior de algodn para reducir la acumulacin de humedad.  Evite el uso de ropa interior cuando duerme hasta que la vaginitis haya mejorado.  Evite la ropa interior o medias de nylon ajustadas y que no tengan un panel de algodn.  Qutese   la ropa hmeda (especialmente el traje de bao) tan pronto como sea posible.  Utilice productos suaves sin  perfume. Evite el uso de sustancias irritantes como:  Aerosoles femeninos perfumados.  Suavizantes de tela.  Detergentes perfumados.  Tampones perfumados.  Jabones o baos de espuma perfumados.  Practique el sexo seguro y use condones. Los condones pueden prevenir la transmisin de la tricomoniasis y la vaginitis viral. SOLICITE ATENCIN MDICA SI:   Siente dolor abdominal.  Tiene fiebre o sntomas persistentes durante ms de 2  3 das.  Tiene fiebre y los sntomas empeoran repentinamente. Document Released: 07/21/2008 Document Revised: 12/28/2011 [employer] Patient Information 2015 [employer]. This information is not intended to replace advice given to you by your health care provider. Make sure you discuss any questions you have with your health care provider.  

## 2014-08-06 LAB — CERVICOVAGINAL ANCILLARY ONLY
Chlamydia: NEGATIVE
Neisseria Gonorrhea: NEGATIVE
WET PREP (BD AFFIRM): POSITIVE — AB

## 2014-08-11 NOTE — ED Notes (Signed)
GC/Chlamydia neg., Candida pos.  Pt. adequately treated with Diflucan. Vassie MoselleYork, Nandika Stetzer M 08/11/2014

## 2015-04-30 ENCOUNTER — Ambulatory Visit: Payer: Self-pay | Attending: Family Medicine

## 2015-07-07 ENCOUNTER — Emergency Department (HOSPITAL_COMMUNITY)
Admission: EM | Admit: 2015-07-07 | Discharge: 2015-07-07 | Disposition: A | Discharge status: Home / Self Care | Payer: No Typology Code available for payment source | Source: Home / Self Care | Attending: Family Medicine | Admitting: Family Medicine

## 2015-07-07 ENCOUNTER — Emergency Department (HOSPITAL_COMMUNITY): Payer: No Typology Code available for payment source

## 2015-07-07 ENCOUNTER — Emergency Department (INDEPENDENT_AMBULATORY_CARE_PROVIDER_SITE_OTHER): Payer: No Typology Code available for payment source

## 2015-07-07 DIAGNOSIS — M25561 Pain in right knee: Secondary | ICD-10-CM

## 2015-07-07 MED ORDER — NAPROXEN 500 MG PO TABS: 500.0000 mg | ORAL_TABLET | Freq: Two times a day (BID) | ORAL | Status: DC

## 2015-07-07 NOTE — ED Notes (Signed)
Pt is here for rt knee pain Pt has had knee pain for months Pt has slight swelling in rt knee,has used ben gay for pain Pt alert and oriented

## 2015-07-07 NOTE — ED Provider Notes (Signed)
CSN: [id number]     Arrival date & time 07/07/15  1320 History   First MD Initiated Contact with Patient 07/07/15 1519     No chief complaint on file.  (Consider location/radiation/quality/duration/timing/severity/associated sxs/prior Treatment) HPI History obtained from father:   LOCATION:left knee SEVERITY: DURATION:2 weeks CONTEXT:unknown QUALITY: MODIFYING FACTORS: OTC meds ASSOCIATED SYMPTOMS:limp TIMING:constant OCCUPATION:  No past medical history on file. Past Surgical History  Procedure Laterality Date  . Sterilization  2013   Family History  Problem Relation Age of Onset  . Asthma Mother   . Hyperlipidemia Mother   . Cancer Father    Social History  Substance Use Topics  . Smoking status: Former Games developermoker  . Smokeless tobacco: Never Used     Comment: smoked for 1 year over 3 years ago  . Alcohol Use: No   OB History    No data available     Review of Systems Left knee Allergies  Review of patient's allergies indicates no known allergies.  Home Medications   Prior to Admission medications   Medication Sig Start Date End Date Taking? Authorizing Provider  fluconazole (DIFLUCAN) 150 MG tablet Take 1 tablet (150 mg total) by mouth daily. 08/05/14   Ria ClockJennifer Lee H Presson, PA   Meds Ordered and Administered this Visit  Medications - No data to display  There were no vitals taken for this visit. No data found.   Physical Exam  Constitutional: She is oriented to person, place, and time.  Musculoskeletal: Normal range of motion.  No instability No redness No swelling  Neurological: She is alert and oriented to person, place, and time.  Skin: Skin is warm and dry.  Psychiatric: She has a normal mood and affect. Her behavior is normal.  Nursing note and vitals reviewed.   ED Course  Procedures (including critical care time)  Labs Review Labs Reviewed - No data to display  Imaging Review No results found.   Visual Acuity Review  Right Eye  Distance:   Left Eye Distance:   Bilateral Distance:    Right Eye Near:   Left Eye Near:    Bilateral Near:       Review of right knee xray: negative  MDM   1. Right knee pain     Patient is reassured that there are no issues that require transfer to higher level of care at this time or additional tests. Patient is advised to continue home symptomatic treatment. Patient is advised that if there are new or worsening symptoms to attend the emergency department, contact primary care provider, or return to UC. Instructions of care provided discharged home in stable condition. Return to work/school note provided.   THIS NOTE WAS GENERATED USING A VOICE RECOGNITION SOFTWARE PROGRAM. ALL REASONABLE EFFORTS  WERE MADE TO PROOFREAD THIS DOCUMENT FOR ACCURACY.  I have verbally reviewed the discharge instructions with the patient. A printed AVS was given to the patient.  All questions were answered prior to discharge.      Tharon AquasFrank C Mechelle Pates, PA 07/07/15 2029

## 2015-07-07 NOTE — Discharge Instructions (Signed)
Cmo usar Network engineeruna rodillera (How to Use a Knee Brace) Neomia DearUna rodillera es un dispositivo que se [place] para brindar sujecin a la rodilla, especialmente durante un perodo de recuperacin despus de una lesin o [place] ciruga. Hay varios tipos de rodilleras. Algunas estn diseadas para evitar las lesiones (rodilleras de prevencin). A menudo se usan durante la prctica de deportes. Otras sirven para estabilizar una rodilla lesionada (rodillera funcional) o para mantenerla inmvil mientras se recupera de una lesin (rodillera para rehabilitacin). Las Eli Lilly and Companypersonas que tienen artritis grave de rodilla pueden beneficiarse con una rodillera que quita algo de presin de la rodilla (rodillera de descarga). La mayora de las rodilleras estn fabricadas con una combinacin de tela y metal o plstico.  Es posible que deba usar una rodillera para lo siguiente:  Engineer, materialsAliviar el dolor de rodilla.  Ayudar a la rodilla a Engineer, sitesoportar el peso corporal (mejorar la estabilidad).  Poder caminar distancias ms largas (mejorar la movilidad).  Evitar las lesiones.  Brindar estabilidad a la rodilla mientras se recupera de [place] ciruga o una lesin. RIESGOS Y COMPLICACIONES Generalmente, las rodilleras son muy seguras de usar. Sin embargo, pueden Teacher, musicpresentarse problemas, por ejemplo:  Irritacin de la piel que puede derivar en una infeccin.  Agravar la afeccin si la rodillera se [place] de un modo incorrecto. CMO USAR UNA RODILLERA Las diferentes rodilleras tendrn distintas instrucciones de Rock[place]uso. El Firefightermdico le indicar o Therapist, musicle mostrar lo siguiente:  Cmo English as a second language teachercolocarse la rodillera.  Cmo ajustarla.  Cundo y con qu frecuencia usar la rodillera.  Cmo quitrsela.  Si necesita dispositivos de Pepco Holdingsayuda adems de la rodillera, como muletas o un bastn. En general, la rodillera:  Debe tener la bisagra alineada con la flexin de la rodilla.  Debe tener correas, sistemas de cierre o cintas que se ajusten bien alrededor de la rodilla.  No debe  estar muy ajustada ni muy floja. CMO CUIDAR UNA RODILLERA  Revise frecuentemente la rodillera para detectar signos de dao, por ejemplo, conexiones o accesorios sueltos. Durante el uso normal, la rodillera puede daarse o Acupuncturistdesgastarse.  Lave las partes de tela de la rodillera con agua y [place]n.  Lea las instrucciones de uso que se adjuntan con la rodillera para Solicitorconocer otras indicaciones especficas respecto de los cuidados. SOLICITE ATENCIN MDICA SI:  La rodillera est muy floja o [place] Islandsmuy ajustada, y no logra acomodarla correctamente.  La rodillera le produce enrojecimiento, hinchazn, hematomas o irritacin en la piel.  La rodillera no resulta eficaz.  La rodillera est intensificando el dolor de rodilla.   Esta informacin no tiene Theme park managercomo fin reemplazar el consejo del mdico. Asegrese de hacerle al mdico cualquier pregunta que tenga.   Document Released: 07/21/2008 Document Revised: 12/24/2014 Elsevier Interactive Patient Education 2016 Elsevier Inc.  Terapia con calor (Heat Therapy) La terapia con calor puede ayudar a aliviar articulaciones y msculos doloridos, lesionados, tensos y rgidos. El calor Mirantrelaja los msculos, lo cual puede ayudar a Engineer, materialsaliviar el dolor.  RIESGOS Y COMPLICACIONES Si tiene cualquiera de los 600 South Third Streetsiguientes problemas, no utilice la terapia con calor a menos que su mdico lo haya autorizado:  Mala circulacin.  Heridas que se estn curando o piel con cicatrices en la zona a tratar.  Diabetes, enfermedades cardacas o hipertensin arterial.  Incapacidad de sentir (entumecimiento) la zona tratada.  Hinchazn inusual de la zona a tratar.  Infecciones activas.  Cogulos sanguneos.  Cncer.  Incapacidad de Marketing executivecomunicar dolor. Esto puede incluir nios pequeos y personas que tienen problemas con la funcin cerebral (demencia).  Embarazo.  La terapia con calor solo se debe usar en lesiones viejas, preexistentes o de larga duracin (crnicas). No utilice la  terapia con calor en lesiones nuevas a menos que el mdico se lo indique. CMO USAR LA TERAPIA CON CALOR Existen varios tipos distintos de terapia con calor, como:  Compresas hmedas calientes.  Bao de agua caliente.  Bolsa de agua caliente.  Almohadilla trmica.  Bolsa de gel caliente.  Vendaje caliente.  Almohadilla trmica. Utilice el mtodo de terapia con calor que le sugiera su mdico. Siga las indicaciones del mdico sobre cmo y cundo usar la terapia con Airline pilotcalor. RECOMENDACIONES GENERALES PARA LA TERAPIA CON CALOR  No duerma mientras [place] la terapia con calor. Utilice la terapia con calor solo mientras est despierto.  La piel puede volverse rosada mientras [place] la terapia con calor. No use la terapia con calor si la piel se pone roja.  No use la terapia con calor si siente un dolor nuevo.  Una temperatura muy alta o una exposicin prolongada al calor puede causar quemaduras. Sea cauto con la terapia de calor para evitar quemar la piel.  No use la terapia con calor en zonas de la piel que ya estn irritadas, como con una erupcin o una quemadura de sol. SOLICITE ATENCIN MDICA SI:  Observa ampollas, enrojecimiento, hinchazn o adormecimiento.  Siente un dolor nuevo.  El dolor Luptonempeora. ASEGRESE DE QUE:  Comprende estas instrucciones.  Controlar su afeccin.  Recibir ayuda de inmediato si no mejora o si empeora.   Esta informacin no tiene Theme park managercomo fin reemplazar el consejo del mdico. Asegrese de hacerle al mdico cualquier pregunta que tenga.   Document Released: 06/27/2011 Document Revised: 04/25/2014 Elsevier Interactive Patient Education [employer]2016 Elsevier Inc.

## 2015-10-27 ENCOUNTER — Ambulatory Visit (HOSPITAL_COMMUNITY)
Admission: EM | Admit: 2015-10-27 | Discharge: 2015-10-27 | Disposition: A | Discharge status: Home / Self Care | Payer: Self-pay | Attending: Family Medicine | Admitting: Family Medicine

## 2015-10-27 ENCOUNTER — Encounter (HOSPITAL_COMMUNITY): Payer: Self-pay | Admitting: Emergency Medicine

## 2015-10-27 DIAGNOSIS — N939 Abnormal uterine and vaginal bleeding, unspecified: Secondary | ICD-10-CM

## 2015-10-27 DIAGNOSIS — N938 Other specified abnormal uterine and vaginal bleeding: Secondary | ICD-10-CM

## 2015-10-27 NOTE — ED Provider Notes (Signed)
CSN: [id number]     Arrival date & time 10/27/15  1902 History   First MD Initiated Contact with Patient 10/27/15 1947     Chief Complaint  Patient presents with  . Vaginal Bleeding   (Consider location/radiation/quality/duration/timing/severity/associated sxs/prior Treatment) HPI Patient is a 35 year old female who states that several months ago she started an exercise program and did not have her period for approximately 3 months. She states that about one month ago she had a 10 day. After that she was clear for about 2 days and now she has been bleeding for over a month. She states that she was seen at a local doctor this past Friday had blood work done and was told that she was not anemic and that she should follow-up with [facility]. Patient is now here for evaluation she denies any pain any home treatment. No lightheadedness or dizziness. She states that she has had a tubal ligation and had a negative pregnancy test on Friday. History reviewed. No pertinent past medical history. Past Surgical History  Procedure Laterality Date  . Sterilization  2013   Family History  Problem Relation Age of Onset  . Asthma Mother   . Hyperlipidemia Mother   . Cancer Father    Social History  Substance Use Topics  . Smoking status: Former Games developer  . Smokeless tobacco: Never Used     Comment: smoked for 1 year over 3 years ago  . Alcohol Use: No   OB History    No data available     Review of Systems  Denies: HEADACHE, NAUSEA, ABDOMINAL PAIN, CHEST PAIN, CONGESTION, DYSURIA, SHORTNESS OF BREATH  Allergies  Review of patient's allergies indicates no known allergies.  Home Medications   Prior to Admission medications   Medication Sig Start Date End Date Taking? Authorizing Provider  acetaminophen (TYLENOL) 325 MG tablet Take 650 mg by mouth every 6 (six) hours as needed.   Yes Historical Provider, MD  fluconazole (DIFLUCAN) 150 MG tablet Take 1 tablet (150 mg total) by mouth daily.  08/05/14  Yes Mathis Fare Presson, PA  naproxen (NAPROSYN) 500 MG tablet Take 1 tablet (500 mg total) by mouth 2 (two) times daily. 07/07/15  Yes Tharon Aquas, PA   Meds Ordered and Administered this Visit  Medications - No data to display  BP 123/79 mmHg  Pulse 72  Temp(Src) 98.3 F (36.8 C) (Oral)  Resp 18  SpO2 99%  LMP 09/17/2015 (Approximate) No data found.   Physical Exam NURSES NOTES AND VITAL SIGNS REVIEWED. CONSTITUTIONAL: Well developed, well nourished, no acute distress HEENT: normocephalic, atraumatic EYES: Conjunctiva normal NECK:normal ROM, supple, no adenopathy PULMONARY:No respiratory distress, normal effort ABDOMINAL: Soft, ND, NT BS+, No CVAT MUSCULOSKELETAL: Normal ROM of all extremities,  SKIN: warm and dry without rash PSYCHIATRIC: Mood and affect, behavior are normal  ED Course  Procedures (including critical care time)  Labs Review Labs Reviewed - No data to display  Imaging Review No results found.   Visual Acuity Review  Right Eye Distance:   Left Eye Distance:   Bilateral Distance:    Right Eye Near:   Left Eye Near:    Bilateral Near:         MDM   1. Vaginal bleeding   2. Dysfunctional uterine bleeding     Patient is reassured that there are no issues that require transfer to higher level of care at this time or additional tests. Patient is advised to continue home symptomatic treatment.  Patient is advised that if there are new or worsening symptoms to attend the emergency department, contact primary care provider, or return to UC. Instructions of care provided discharged home in stable condition.    THIS NOTE WAS GENERATED USING A VOICE RECOGNITION SOFTWARE PROGRAM. ALL REASONABLE EFFORTS  WERE MADE TO PROOFREAD THIS DOCUMENT FOR ACCURACY.  I have verbally reviewed the discharge instructions with the patient. A printed AVS was given to the patient.  All questions were answered prior to discharge.      Tharon AquasFrank C  Bettymae Yott, PA 10/27/15 2119

## 2015-10-27 NOTE — ED Notes (Signed)
[facility]. She stated that she did not have a period for 3 months and then did for 10 days and now it has become irregular.

## 2015-10-27 NOTE — Discharge Instructions (Signed)
Metrorragia funcional (Dysfunctional Uterine Bleeding) La metrorragia funcional es una hemorragia anormal proveniente del tero. La metrorragia funcional incluye estos sntomas:  Menstruacin que se adelanta o se atrasa.  Menstruacin menos o ms abundante, o con cogulos sanguneos.  Hemorragias entre los perodos menstruales.  Ausencia de una o ms menstruaciones.  Hemorragias luego de mantener relaciones sexuales.  Sangrado luego de la menopausia. INSTRUCCIONES PARA EL CUIDADO EN EL HOGAR  Est atenta a cualquier cambio en los sntomas. Estas indicaciones pueden ayudarla con el trastorno: Comidas  Siga una dieta equilibrada. Incluya alimentos con alto contenido de hierro, como hgado, carne, mariscos, verduras de hoja verde y huevos.  Si tiene estreimiento:  Beba abundante agua.  Consuma frutas y verduras con alto contenido de agua y fibra, como espinaca, zanahorias, frambuesas, manzanas y mango. Medicamentos  Tome los medicamentos de venta libre y los recetados solamente como se lo haya indicado el mdico.  No haga cambios en los medicamentos sin hablar con el mdico.  La aspirina o los medicamentos que la contienen pueden aumentar la hemorragia. No tome esos medicamentos:  Durante la semana previa a la menstruacin.  Durante la menstruacin.  Si le recetaron comprimidos de hierro, tmelos como se lo haya indicado el mdico. Estos ayudan a reponer el hierro que el organismo pierde debido a este trastorno. Actividad  Si debe cambiarse el apsito o el tampn ms de una vez cada 2horas:  Acustese con los pies elevados.  Colquese una compresa fra en la parte baja del abdomen.  Haga todo el reposo que pueda hasta que la hemorragia se detenga o disminuya.  No trate de adelgazar hasta que la hemorragia se detenga y los niveles de hierro en la sangre se normalicen. Otras indicaciones  Durante dos meses, anote lo siguiente:  La fecha de comienzo de la  menstruacin.  La fecha de su finalizacin.  Los momentos en los que tiene una hemorragia anormal.  Los problemas que advierte.  Concurra a todas las visitas de control como se lo haya indicado el mdico. Esto es importante. SOLICITE ATENCIN MDICA SI:  Se siente dbil o que va a desvanecerse.  Tiene nuseas y vmitos.  No puede comer ni beber sin vomitar.  Tiene mareos o diarrea mientras toma los medicamentos.  Est tomando anticonceptivos u hormonas, y desea cambiar o suspender estos medicamentos. SOLICITE ATENCIN MDICA DE INMEDIATO SI:  Tiene escalofros o fiebre.  Debe cambiarse el apsito o el tampn ms de una vez por hora.  La hemorragia se vuelve ms abundante o el flujo menstrual contiene cogulos con ms frecuencia.  Siente dolor en el abdomen.  Pierde la conciencia.  Le aparece una erupcin cutnea.   Esta informacin no tiene como fin reemplazar el consejo del mdico. Asegrese de hacerle al mdico cualquier pregunta que tenga.   Document Released: 01/12/2005 Document Revised: 12/24/2014 Elsevier Interactive Patient Education 2016 Elsevier Inc.  

## 2015-10-28 ENCOUNTER — Inpatient Hospital Stay (HOSPITAL_COMMUNITY)
Admission: AD | Admit: 2015-10-28 | Discharge: 2015-10-28 | Disposition: A | Discharge status: Home / Self Care | Payer: No Typology Code available for payment source | Source: Ambulatory Visit | Attending: Obstetrics & Gynecology | Admitting: Obstetrics & Gynecology

## 2015-10-28 ENCOUNTER — Encounter (HOSPITAL_COMMUNITY): Payer: Self-pay | Admitting: *Deleted

## 2015-10-28 DIAGNOSIS — N390 Urinary tract infection, site not specified: Secondary | ICD-10-CM | POA: Insufficient documentation

## 2015-10-28 DIAGNOSIS — N939 Abnormal uterine and vaginal bleeding, unspecified: Secondary | ICD-10-CM

## 2015-10-28 DIAGNOSIS — Z87891 Personal history of nicotine dependence: Secondary | ICD-10-CM | POA: Insufficient documentation

## 2015-10-28 DIAGNOSIS — M1990 Unspecified osteoarthritis, unspecified site: Secondary | ICD-10-CM | POA: Insufficient documentation

## 2015-10-28 HISTORY — DX: Unspecified osteoarthritis, unspecified site: M19.90

## 2015-10-28 LAB — URINALYSIS, ROUTINE W REFLEX MICROSCOPIC
Bilirubin Urine: NEGATIVE
Glucose, UA: NEGATIVE mg/dL
Ketones, ur: NEGATIVE mg/dL
Leukocytes, UA: NEGATIVE
Nitrite: NEGATIVE
Protein, ur: NEGATIVE mg/dL
Specific Gravity, Urine: 1.01 (ref 1.005–1.030)
pH: 6 (ref 5.0–8.0)

## 2015-10-28 LAB — WET PREP, GENITAL
Clue Cells Wet Prep HPF POC: NONE SEEN
Sperm: NONE SEEN
Trich, Wet Prep: NONE SEEN
Yeast Wet Prep HPF POC: NONE SEEN

## 2015-10-28 LAB — CBC
HCT: 38.6 % (ref 36.0–46.0)
HEMOGLOBIN: 13.1 g/dL (ref 12.0–15.0)
MCH: 27.8 pg (ref 26.0–34.0)
MCHC: 33.9 g/dL (ref 30.0–36.0)
MCV: 81.8 fL (ref 78.0–100.0)
Platelets: 323 10*3/uL (ref 150–400)
RBC: 4.72 MIL/uL (ref 3.87–5.11)
RDW: 13.8 % (ref 11.5–15.5)
WBC: 6.4 10*3/uL (ref 4.0–10.5)

## 2015-10-28 LAB — URINE MICROSCOPIC-ADD ON
Bacteria, UA: NONE SEEN
WBC, UA: NONE SEEN WBC/hpf (ref 0–5)

## 2015-10-28 LAB — POCT PREGNANCY, URINE: PREG TEST UR: NEGATIVE

## 2015-10-28 MED ORDER — MEGESTROL ACETATE 20 MG PO TABS: ORAL_TABLET | ORAL | Status: DC

## 2015-10-28 NOTE — MAU Provider Note (Signed)
Chief Complaint: Vaginal Bleeding   First Provider Initiated Contact with Patient 10/28/15 1224      SUBJECTIVE HPI: Marissa [name] is a 35 y.o. [id number] who presents to maternity admissions reporting onset of menses 1 month ago, heavy x 3-4 days, then moderate bleeding daily continuing today.  She denies associated symptoms, denies pain, n/v, or dizziness.  She saw her PCP and was diagnosed with UTI and treated with Cipro, which she is currently taking, and started on Indocin.  The bleeding is unchanged.  She was told to follow up at [facility] if bleeding persists. She denies vaginal itching/burning, urinary symptoms, h/a, dizziness, n/v, or fever/chills.     HPI  Past Medical History  Diagnosis Date  . Arthritis    Past Surgical History  Procedure Laterality Date  . Sterilization  2013  . Tubal ligation     Social History   Social History  . Marital Status: Married    Spouse Name: N/A  . Number of Children: N/A  . Years of Education: N/A   Occupational History  . Not on file.   Social History Main Topics  . Smoking status: Former Games developer  . Smokeless tobacco: Never Used     Comment: smoked for 1 year over 3 years ago  . Alcohol Use: No  . Drug Use: No  . Sexual Activity: Yes    Birth Control/ Protection: Surgical   Other Topics Concern  . Not on file   Social History Narrative   No current facility-administered medications on file prior to encounter.   Current Outpatient Prescriptions on File Prior to Encounter  Medication Sig Dispense Refill  . acetaminophen (TYLENOL) 325 MG tablet Take 650 mg by mouth every 6 (six) hours as needed for mild pain or moderate pain.     . naproxen (NAPROSYN) 500 MG tablet Take 1 tablet (500 mg total) by mouth 2 (two) times daily. (Patient not taking: Reported on 10/28/2015) 30 tablet 0   No Known Allergies  ROS:  Review of Systems  Constitutional: Negative for fever, chills and fatigue.  Respiratory: Negative for shortness of  breath.   Cardiovascular: Negative for chest pain.  Gastrointestinal: Negative for nausea and vomiting.  Genitourinary: Positive for vaginal bleeding. Negative for dysuria, flank pain, vaginal discharge, difficulty urinating, vaginal pain and pelvic pain.  Neurological: Negative for dizziness and headaches.  Psychiatric/Behavioral: Negative.      I have reviewed patient's Past Medical Hx, Surgical Hx, Family Hx, Social Hx, medications and allergies.   Physical Exam  Patient Vitals for the past 24 hrs:  BP Temp Temp src Pulse Resp Height Weight  10/28/15 1027 118/75 mmHg 97.9 F (36.6 C) Oral 66 16 5\' 1"  (1.549 m) 174 lb (78.926 kg)   Constitutional: Well-developed, well-nourished female in no acute distress.  Cardiovascular: normal rate Respiratory: normal effort GI: Abd soft, non-tender. Pos BS x 4 MS: Extremities nontender, no edema, normal ROM Neurologic: Alert and oriented x 4.  GU: Neg CVAT.  PELVIC EXAM: Cervix pink, visually closed, without lesion, moderate amount dark red bleeding, vaginal walls and external genitalia normal Bimanual exam: Cervix 0/long/high, firm, anterior, neg CMT, uterus nontender, slightly enlarged, adnexa without tenderness, enlargement, or mass   LAB RESULTS Results for orders placed or performed during the hospital encounter of 10/28/15 (from the past 24 hour(s))  Urinalysis, Routine w reflex microscopic (not at [facility])     Status: Abnormal   Collection Time: 10/28/15 10:32 AM  Result Value Ref Range   Color,  Urine YELLOW YELLOW   APPearance CLEAR CLEAR   Specific Gravity, Urine 1.010 1.005 - 1.030   pH 6.0 5.0 - 8.0   Glucose, UA NEGATIVE NEGATIVE mg/dL   Hgb urine dipstick LARGE (A) NEGATIVE   Bilirubin Urine NEGATIVE NEGATIVE   Ketones, ur NEGATIVE NEGATIVE mg/dL   Protein, ur NEGATIVE NEGATIVE mg/dL   Nitrite NEGATIVE NEGATIVE   Leukocytes, UA NEGATIVE NEGATIVE  Urine microscopic-add on     Status: Abnormal   Collection Time: 10/28/15  10:32 AM  Result Value Ref Range   Squamous Epithelial / LPF 0-5 (A) NONE SEEN   WBC, UA NONE SEEN 0 - 5 WBC/hpf   RBC / HPF 6-30 0 - 5 RBC/hpf   Bacteria, UA NONE SEEN NONE SEEN  Pregnancy, urine POC     Status: None   Collection Time: 10/28/15 11:20 AM  Result Value Ref Range   Preg Test, Ur NEGATIVE NEGATIVE  Wet prep, genital     Status: Abnormal   Collection Time: 10/28/15 12:20 PM  Result Value Ref Range   Yeast Wet Prep HPF POC NONE SEEN NONE SEEN   Trich, Wet Prep NONE SEEN NONE SEEN   Clue Cells Wet Prep HPF POC NONE SEEN NONE SEEN   WBC, Wet Prep HPF POC FEW (A) NONE SEEN   Sperm NONE SEEN   CBC     Status: None   Collection Time: 10/28/15  1:03 PM  Result Value Ref Range   WBC 6.4 4.0 - 10.5 K/uL   RBC 4.72 3.87 - 5.11 MIL/uL   Hemoglobin 13.1 12.0 - 15.0 g/dL   HCT 16.1 09.6 - 04.5 %   MCV 81.8 78.0 - 100.0 fL   MCH 27.8 26.0 - 34.0 pg   MCHC 33.9 30.0 - 36.0 g/dL   RDW 40.9 81.1 - 91.4 %   Platelets 323 150 - 400 K/uL       IMAGING No results found.  MAU Management/MDM: Ordered labs and reviewed results.  Pt stable without symptoms of anemia and with Hgb 13.1.  Will treat AUB with Megace taper, then f/u in [facility] in 2-3 months.  Pt stable at time of discharge.  ASSESSMENT 1. Abnormal uterine bleeding (AUB)     PLAN Discharge home with bleeding precautions    Medication List    STOP taking these medications        naproxen 500 MG tablet  Commonly known as:  NAPROSYN      TAKE these medications        acetaminophen 325 MG tablet  Commonly known as:  TYLENOL  Take 650 mg by mouth every 6 (six) hours as needed for mild pain or moderate pain.     ciprofloxacin 500 MG tablet  Commonly known as:  CIPRO  Take 500 mg by mouth 2 (two) times daily.     indomethacin 25 MG capsule  Commonly known as:  INDOCIN  Take 25 mg by mouth 3 (three) times daily with meals.     megestrol 20 MG tablet  Commonly known as:  MEGACE  Take 3 tablets twice per day  for 3 days, 2 tablets twice per day for 3 days, then 2 tablets daily.  Tome 3 tabletas dos veces al da durante 3 [place], 2 tabletas [employer] veces al da durante 3 [place], luego 2 tabletas al [employer].     metroNIDAZOLE 500 MG tablet  Commonly known as:  FLAGYL  Take 500 mg by mouth 3 (three) times daily.  shark liver oil-cocoa butter 0.25-3-85.5 % suppository  Commonly known as:  PREPARATION H  Place 1 suppository rectally as needed for hemorrhoids.           Follow-up Information    [facility]   Specialty:  Obstetrics and Gynecology   Why:  The clinic will call you with appointment. La clnica le llamar con Susanne Bordersuna cita fecha y hora., Regresar a MAU segn sea necesario para emergencia   Contact information:   [address] [place] [id number] [phone]      Sharen CounterLisa Leftwich-Kirby Certified Nurse-Midwife 10/28/2015  2:09 PM

## 2015-10-28 NOTE — Discharge Instructions (Signed)
Sangrado uterino anormal  (Abnormal Uterine Bleeding)  El sangrado uterino anormal puede afectar a las mujeres que están en diversas etapas de la vida, desde adolescentes, mujeres fértiles y mujeres embarazadas, hasta mujeres que han llegado a la menopausia. Hay diversas clases de sangrado uterino que se consideran anormales, entre ellas:  · Pérdidas de sangre o hemorragias entre los períodos.  · Hemorragias luego de mantener relaciones sexuales.  · Sangrado abundante o más que lo habitual.  · Períodos que duran más que lo normal.  · Sangrado luego de la menopausia.  Muchos casos de sangrado uterino anormal son leves y simples de tratar, mientras que otros son más graves. El médico debe evaluar cualquier clase de sangrado anormal. El tratamiento dependerá de la causa del sangrado.  INSTRUCCIONES PARA EL CUIDADO EN EL HOGAR  Controle su afección para ver si hay cambios. Las siguientes indicaciones ayudarán a aliviar cualquier molestia que pueda sentir:  · Evite las duchas vaginales y el uso de tampones según las indicaciones del médico.  · Cámbiese las compresas con frecuencia.  Deberá hacerse exámenes pélvicos regulares y pruebas de Papanicolaou. Cumpla con todas las visitas de control y exámanes diagnósticos, según le indique su médico.   SOLICITE ATENCIÓN MÉDICA SI:   · El sangrado dura más de 1 semana.  · Se siente mareada por momentos.  SOLICITE ATENCIÓN MÉDICA DE INMEDIATO SI:   · Se desmaya.  · Debe cambiarse la compresa cada 15 a 30 minutos.  · Siente dolor abdominal.  · Tiene fiebre.  · Se siente débil o presenta sudoración.  · Elimina coágulos grandes por la vagina.  · Comienza a sentir náuseas y vomita.  ASEGÚRESE DE QUE:   · Comprende estas instrucciones.  · Controlará su afección.  · Recibirá ayuda de inmediato si no mejora o si empeora.     Esta información no tiene como fin reemplazar el consejo del médico. Asegúrese de hacerle al médico cualquier pregunta que tenga.     Document Released: 04/04/2005  Document Revised: 04/09/2013  Elsevier Interactive Patient Education ©2016 Elsevier Inc.

## 2015-10-28 NOTE — MAU Note (Addendum)
Pt C/O abnormal vaginal bleeding for the last 1 1/2 months, has changed one pad this a.m.  Denies pain. Was seen @ Urgent Care yesterday, pt states they told her to come to MAU.  Pt was taking cipro, flagyl, & indocin but stopped two days ago because she felt they were making her dizzy.

## 2015-10-29 LAB — GC/CHLAMYDIA PROBE AMP (~~LOC~~) NOT AT ARMC
Chlamydia: NEGATIVE
Neisseria Gonorrhea: NEGATIVE

## 2015-10-29 LAB — HIV ANTIBODY (ROUTINE TESTING W REFLEX): HIV Screen 4th Generation wRfx: NONREACTIVE

## 2015-11-20 ENCOUNTER — Encounter (HOSPITAL_COMMUNITY): Payer: Self-pay

## 2015-11-20 ENCOUNTER — Ambulatory Visit (HOSPITAL_COMMUNITY)
Admission: EM | Admit: 2015-11-20 | Discharge: 2015-11-20 | Disposition: A | Discharge status: Home / Self Care | Payer: Self-pay | Attending: Emergency Medicine | Admitting: Emergency Medicine

## 2015-11-20 DIAGNOSIS — K648 Other hemorrhoids: Secondary | ICD-10-CM

## 2015-11-20 MED ORDER — HYDROCORTISONE ACETATE 25 MG RE SUPP: 25.0000 mg | Freq: Two times a day (BID) | RECTAL | 1 refills | Status: DC

## 2015-11-20 NOTE — ED Provider Notes (Signed)
CSN: [id number]     Arrival date & time 11/20/15  1443 History   First MD Initiated Contact with Patient 11/20/15 1624     Chief Complaint  Patient presents with  . Rectal Bleeding   (Consider location/radiation/quality/duration/timing/severity/associated sxs/prior Treatment) 35 year old female complaining of rectal burning and pain for one week. This only occurs with defecation. She also describes bright red bleeding associated with defecation. Denies abdominal pain or pelvic pain. No vomiting, diarrhea or constipation. History of hemorrhoids.      Past Medical History:  Diagnosis Date  . Arthritis    History reviewed. No pertinent surgical history. History reviewed. No pertinent family history. Social History  Substance Use Topics  . Smoking status: Never Smoker  . Smokeless tobacco: Never Used  . Alcohol use No   OB History    Gravida Para Term Preterm AB Living   1             SAB TAB Ectopic Multiple Live Births                 Review of Systems  Constitutional: Negative.   HENT: Negative.   Respiratory: Negative.   Gastrointestinal: Positive for anal bleeding, blood in stool and rectal pain. Negative for abdominal distention, abdominal pain, constipation, diarrhea, nausea and vomiting.  Genitourinary: Negative.   Hematological: Does not bruise/bleed easily.  All other systems reviewed and are negative.   Allergies  Review of patient's allergies indicates no known allergies.  Home Medications   Prior to Admission medications   Medication Sig Start Date End Date Taking? Authorizing Provider  MEGESTROL ACETATE PO Take by mouth daily.   Yes Historical Provider, MD  hydrocortisone (ANUSOL-HC) 25 MG suppository Place 1 suppository (25 mg total) rectally 2 (two) times daily. 11/20/15   Hayden Rasmussen, NP   Meds Ordered and Administered this Visit  Medications - No data to display  BP 118/80 (BP Location: Left Arm)   Pulse 60   Temp 97.8 F (36.6 C) (Oral)   LMP  11/01/2015 (Approximate)   SpO2 100%   Breastfeeding? Unknown  No data found.   Physical Exam  Constitutional: She is oriented to person, place, and time. She appears well-developed and well-nourished. No distress.  HENT:  Head: Normocephalic and atraumatic.  Eyes: EOM are normal.  Neck: Normal range of motion. Neck supple.  Cardiovascular: Normal rate.   Pulmonary/Chest: Effort normal. No respiratory distress.  Genitourinary:  Genitourinary Comments: Timika, MA present. External anal exam reveals hemorrhoidal tag in the posterior aspect. No evidence of bleeding. No external hemorrhoid. There is tenderness to the external anus as well as stretching affect of the DRE. DRE was limited due to pain. No evidence of fissure. No blood seen. No active bleeding.  Musculoskeletal: She exhibits no edema.  Neurological: She is alert and oriented to person, place, and time. She exhibits normal muscle tone.  Skin: Skin is warm and dry.  Psychiatric: She has a normal mood and affect.  Nursing note and vitals reviewed.   Urgent Care Course   Clinical Course    Procedures (including critical care time)  Labs Review Labs Reviewed - No data to display  Imaging Review No results found.   Visual Acuity Review  Right Eye Distance:   Left Eye Distance:   Bilateral Distance:    Right Eye Near:   Left Eye Near:    Bilateral Near:         MDM   1. Internal hemorrhoid, bleeding  Take Colace stool softener 3 times a day for the next 2 weeks. Hot sitz baths.  Increase fiber in diet. Meds ordered this encounter  Medications  . MEGESTROL ACETATE PO    Sig: Take by mouth daily.  . hydrocortisone (ANUSOL-HC) 25 MG suppository    Sig: Place 1 suppository (25 mg total) rectally 2 (two) times daily.    Dispense:  12 suppository    Refill:  1    Order Specific Question:   Supervising Provider    Answer:   Charm Rings [[id number]]   F/U with PCP    Hayden Rasmussen, NP 11/20/15 1643

## 2015-11-20 NOTE — Discharge Instructions (Signed)
Take Colace stool softener 3 times a day for the next 2 weeks. Hot sitz baths.  Increase fiber in diet.

## 2015-11-20 NOTE — ED Triage Notes (Signed)
Pt presents with rectal bleeding and bloody stool x1 week, pt has been using preparation H and supositories. pt [place] living [place] her and he is currently in the hospital with HIV and pt is concerned about her health and would like to consult with physician,  No acute distress

## 2015-11-29 ENCOUNTER — Emergency Department (HOSPITAL_COMMUNITY): Payer: Self-pay

## 2015-11-29 ENCOUNTER — Emergency Department (HOSPITAL_COMMUNITY)
Admission: EM | Admit: 2015-11-29 | Discharge: 2015-11-29 | Disposition: A | Discharge status: Home / Self Care | Payer: Self-pay | Attending: Emergency Medicine | Admitting: Emergency Medicine

## 2015-11-29 ENCOUNTER — Encounter (HOSPITAL_COMMUNITY): Payer: Self-pay | Admitting: *Deleted

## 2015-11-29 DIAGNOSIS — N939 Abnormal uterine and vaginal bleeding, unspecified: Secondary | ICD-10-CM | POA: Insufficient documentation

## 2015-11-29 DIAGNOSIS — R109 Unspecified abdominal pain: Secondary | ICD-10-CM

## 2015-11-29 DIAGNOSIS — Z79899 Other long term (current) drug therapy: Secondary | ICD-10-CM | POA: Insufficient documentation

## 2015-11-29 DIAGNOSIS — Z87891 Personal history of nicotine dependence: Secondary | ICD-10-CM | POA: Insufficient documentation

## 2015-11-29 LAB — URINE MICROSCOPIC-ADD ON

## 2015-11-29 LAB — COMPREHENSIVE METABOLIC PANEL
ALK PHOS: 65 U/L (ref 38–126)
ALT: 17 U/L (ref 14–54)
AST: 20 U/L (ref 15–41)
Albumin: 3.8 g/dL (ref 3.5–5.0)
Anion gap: 9 (ref 5–15)
BILIRUBIN TOTAL: 0.4 mg/dL (ref 0.3–1.2)
BUN: 13 mg/dL (ref 6–20)
CALCIUM: 9 mg/dL (ref 8.9–10.3)
CO2: 24 mmol/L (ref 22–32)
CREATININE: 0.67 mg/dL (ref 0.44–1.00)
Chloride: 104 mmol/L (ref 101–111)
GFR calc Af Amer: >60 mL/min (ref >60)
Glucose, Bld: 105 mg/dL — ABNORMAL HIGH (ref 65–99)
Potassium: 3.7 mmol/L (ref 3.5–5.1)
Sodium: 137 mmol/L (ref 135–145)
TOTAL PROTEIN: 6.9 g/dL (ref 6.5–8.1)

## 2015-11-29 LAB — URINALYSIS, ROUTINE W REFLEX MICROSCOPIC
Bilirubin Urine: NEGATIVE
GLUCOSE, UA: NEGATIVE mg/dL
KETONES UR: NEGATIVE mg/dL
NITRITE: NEGATIVE
PROTEIN: NEGATIVE mg/dL
Specific Gravity, Urine: 1.02 (ref 1.005–1.030)
pH: 5.5 (ref 5.0–8.0)

## 2015-11-29 LAB — CBC
HCT: 39.5 % (ref 36.0–46.0)
Hemoglobin: 12.9 g/dL (ref 12.0–15.0)
MCH: 28 pg (ref 26.0–34.0)
MCHC: 32.7 g/dL (ref 30.0–36.0)
MCV: 85.7 fL (ref 78.0–100.0)
PLATELETS: 289 10*3/uL (ref 150–400)
RBC: 4.61 MIL/uL (ref 3.87–5.11)
RDW: 13 % (ref 11.5–15.5)
WBC: 8.6 10*3/uL (ref 4.0–10.5)

## 2015-11-29 LAB — RAPID URINE DRUG SCREEN, HOSP PERFORMED
AMPHETAMINES: NOT DETECTED
Barbiturates: NOT DETECTED
Benzodiazepines: NOT DETECTED
COCAINE: NOT DETECTED
OPIATES: NOT DETECTED
TETRAHYDROCANNABINOL: NOT DETECTED

## 2015-11-29 LAB — POC URINE PREG, ED: Preg Test, Ur: NEGATIVE

## 2015-11-29 LAB — LIPASE, BLOOD: Lipase: 33 U/L (ref 11–51)

## 2015-11-29 MED ORDER — HYDROCODONE-ACETAMINOPHEN 5-325 MG PO TABS: ORAL_TABLET | ORAL | 0 refills | Status: DC

## 2015-11-29 MED ORDER — ONDANSETRON HCL 4 MG/2ML IJ SOLN
4.0000 mg | Freq: Once | INTRAMUSCULAR | Status: AC
  Administered 2015-11-29: 4 mg via INTRAVENOUS
  Filled 2015-11-29: qty 2

## 2015-11-29 MED ORDER — OXYCODONE-ACETAMINOPHEN 5-325 MG PO TABS
1.0000 | ORAL_TABLET | Freq: Once | ORAL | Status: AC
  Administered 2015-11-29: 1 via ORAL
  Filled 2015-11-29: qty 1

## 2015-11-29 MED ORDER — ONDANSETRON HCL 4 MG/2ML IJ SOLN: 4.0000 mg | Freq: Once | INTRAMUSCULAR | Status: DC

## 2015-11-29 MED ORDER — ONDANSETRON 4 MG PO TBDP
8.0000 mg | ORAL_TABLET | Freq: Once | ORAL | Status: AC
  Administered 2015-11-29: 8 mg via ORAL
  Filled 2015-11-29: qty 2

## 2015-11-29 MED ORDER — HYDROMORPHONE HCL 1 MG/ML IJ SOLN
1.0000 mg | Freq: Once | INTRAMUSCULAR | Status: AC
  Administered 2015-11-29: 1 mg via INTRAVENOUS
  Filled 2015-11-29: qty 1

## 2015-11-29 NOTE — ED Notes (Signed)
PAIN CONTINUES INCREASING  MED GIVEN

## 2015-11-29 NOTE — ED Provider Notes (Signed)
[facility]-EMERGENCY DEPT Provider Note   CSN: [id number] Arrival date & time: 11/29/15  0020  First Provider Contact:  First MD Initiated Contact with Patient 11/29/15 0315        History   Chief Complaint Chief Complaint  Patient presents with  . Abdominal Pain    HPI Marissa [name] is a 35 y.o. female.  Patient with h/o c-sections -- presents with acute onset of pelvic and rectal pain starting approximately 8 PM. Pain is severe. Patient states that she is having vaginal bleeding but that this is not her period. She has had nausea but no vomiting. No fevers. She has pain in her lower back. No treatments prior to arrival. Course is constant. Nothing makes symptoms better or worse. She was seen at [facility] last month for same -- placed on Megace for bleeding which patient states she has been taking. The onset of this condition was acute. The course is constant. Aggravating factors: none. Alleviating factors: none.        Past Medical History:  Diagnosis Date  . Arthritis     Patient Active Problem List   Diagnosis Date Noted  . Weight gain 09/26/2013  . Amenorrhea 09/26/2013  . Abdominal pain, left lower quadrant 09/26/2013    Past Surgical History:  Procedure Laterality Date  . STERILIZATION  2013  . TUBAL LIGATION      OB History    Gravida Para Term Preterm AB Living   5 4 4   1 4    SAB TAB Ectopic Multiple Live Births   1               Home Medications    Prior to Admission medications   Medication Sig Start Date End Date Taking? Authorizing Provider  acetaminophen (TYLENOL) 325 MG tablet Take 650 mg by mouth every 6 (six) hours as needed for mild pain or moderate pain.     Historical Provider, MD  ciprofloxacin (CIPRO) 500 MG tablet Take 500 mg by mouth 2 (two) times daily.    Historical Provider, MD  indomethacin (INDOCIN) 25 MG capsule Take 25 mg by mouth 3 (three) times daily with meals.    Historical Provider, MD  megestrol (MEGACE) 20 MG tablet Take  3 tablets twice per day for 3 days, 2 tablets twice per day for 3 days, then 2 tablets daily.  [place] 3 tabletas dos veces al da durante 3 [place], 2 tabletas [place] veces al da durante 3 [place], luego 2 tabletas al [employer]. 10/28/15   Lisa A Leftwich-Kirby, CNM  metroNIDAZOLE (FLAGYL) 500 MG tablet Take 500 mg by mouth 3 (three) times daily.    Historical Provider, MD  shark liver oil-cocoa butter (PREPARATION H) 0.25-3-85.5 % suppository Place 1 suppository rectally as needed for hemorrhoids.    Historical Provider, MD    Family History Family History  Problem Relation Age of Onset  . Asthma Mother   . Hyperlipidemia Mother   . Cancer Father     Social History Social History  Substance Use Topics  . Smoking status: Former Games developermoker  . Smokeless tobacco: Never Used     Comment: smoked for 1 year over 3 years ago  . Alcohol use No     Allergies   Review of patient's allergies indicates no known allergies.   Review of Systems Review of Systems  Constitutional: Negative for fever.  HENT: Negative for rhinorrhea and sore throat.   Eyes: Negative for redness.  Respiratory: Negative for cough.   Cardiovascular:  Negative for chest pain.  Gastrointestinal: Positive for abdominal pain and nausea. Negative for diarrhea and vomiting.  Genitourinary: Positive for vaginal bleeding. Negative for dysuria and vaginal discharge.  Musculoskeletal: Negative for myalgias.  Skin: Negative for rash.  Neurological: Negative for headaches.     Physical Exam Updated Vital Signs BP 141/80   Pulse 68   Temp 98.2 F (36.8 C) (Oral)   Resp 24   Ht 5\' 1"  (1.549 m)   Wt 80.5 kg   LMP 11/29/2015   SpO2 100%   BMI 33.53 kg/m   Physical Exam  Constitutional: She appears well-developed and well-nourished. She appears distressed (patient moaning in pain).  HENT:  Head: Normocephalic and atraumatic.  Eyes: Conjunctivae are normal. Right eye exhibits no discharge. Left eye exhibits no discharge.  Neck:  Normal range of motion. Neck supple.  Cardiovascular: Normal rate, regular rhythm and normal heart sounds.   Pulmonary/Chest: Effort normal and breath sounds normal.  Abdominal: Soft. There is no tenderness (mid-line lower abd).  Genitourinary: Uterus normal. Pelvic exam was performed with patient supine. There is no tenderness on the right labia. There is no tenderness on the left labia. Uterus is not enlarged and not tender. Cervix exhibits discharge (blood). Cervix exhibits no motion tenderness. Right adnexum displays no mass and no tenderness. Left adnexum displays no mass and no tenderness. There is bleeding in the vagina. No signs of injury around the vagina.  Genitourinary Comments: Patient with moderate bleeding, clots, and mucus.  Neurological: She is alert.  Skin: Skin is warm and dry.  Psychiatric: She has a normal mood and affect.  Nursing note and vitals reviewed.    ED Treatments / Results  Labs (all labs ordered are listed, but only abnormal results are displayed) Labs Reviewed  COMPREHENSIVE METABOLIC PANEL - Abnormal; Notable for the following:       Result Value   Glucose, Bld 105 (*)    All other components within normal limits  URINALYSIS, ROUTINE W REFLEX MICROSCOPIC (NOT AT [facility]) - Abnormal; Notable for the following:    Hgb urine dipstick LARGE (*)    Leukocytes, UA SMALL (*)    All other components within normal limits  URINE MICROSCOPIC-ADD ON - Abnormal; Notable for the following:    Squamous Epithelial / LPF 0-5 (*)    Bacteria, UA FEW (*)    All other components within normal limits  LIPASE, BLOOD  CBC  URINE RAPID DRUG SCREEN, HOSP PERFORMED  POC URINE PREG, ED  GC/CHLAMYDIA PROBE AMP ([place]) NOT AT [facility]    Radiology US Transvaginal Non-ob  Result Date: 11/29/2015 CLINICAL DATA:  Initial evaluation for acute pelvic pain. EXAM: TRANSABDOMINAL AND TRANSVAGINAL ULTRASOUND OF PELVIS TECHNIQUE: Both transabdominal and transvaginal ultrasound  examinations of the pelvis were performed. Transabdominal technique was performed for global imaging of the pelvis including uterus, ovaries, adnexal regions, and pelvic cul-de-sac. It was necessary to proceed with endovaginal exam following the transabdominal exam to visualize the endometrium. COMPARISON:  None available. FINDINGS: Uterus Measurements: 11.4 x 5.7 x 5.8 cm. No fibroids or other mass visualized. Endometrium Thickness: 11.4 mm. Scattered mildly heterogeneous echogenic material within the endometrial canal, which may reflect blood products in the setting of vaginal bleeding. No definite focal abnormality identified. Right ovary Measurements: 2.8 x 1.8 x 2.9 cm. Normal appearance/no adnexal mass. Left ovary Measurements: 3.5 x 1.6 x 3.9 cm. Normal appearance/no adnexal mass. Other findings Trace free fluid within the pelvis, likely physiologic. IMPRESSION: 1. Mildly heterogeneous  appearance of the endometrium/endometrial canal, which may reflect blood products given the history of vaginal bleeding. Correlation with menstrual cycle and symptomatology recommended. No definite focal abnormality identified. A short interval follow-up ultrasound in 6-12 weeks to ensure resolution is suggested. 2. Otherwise normal pelvic ultrasound. Electronically Signed   By: Rise Mu M.D.   On: 11/29/2015 06:02   US Pelvis Limited  Result Date: 11/29/2015 CLINICAL DATA:  Initial evaluation for acute pelvic pain. EXAM: TRANSABDOMINAL AND TRANSVAGINAL ULTRASOUND OF PELVIS TECHNIQUE: Both transabdominal and transvaginal ultrasound examinations of the pelvis were performed. Transabdominal technique was performed for global imaging of the pelvis including uterus, ovaries, adnexal regions, and pelvic cul-de-sac. It was necessary to proceed with endovaginal exam following the transabdominal exam to visualize the endometrium. COMPARISON:  None available. FINDINGS: Uterus Measurements: 11.4 x 5.7 x 5.8 cm. No  fibroids or other mass visualized. Endometrium Thickness: 11.4 mm. Scattered mildly heterogeneous echogenic material within the endometrial canal, which may reflect blood products in the setting of vaginal bleeding. No definite focal abnormality identified. Right ovary Measurements: 2.8 x 1.8 x 2.9 cm. Normal appearance/no adnexal mass. Left ovary Measurements: 3.5 x 1.6 x 3.9 cm. Normal appearance/no adnexal mass. Other findings Trace free fluid within the pelvis, likely physiologic. IMPRESSION: 1. Mildly heterogeneous appearance of the endometrium/endometrial canal, which may reflect blood products given the history of vaginal bleeding. Correlation with menstrual cycle and symptomatology recommended. No definite focal abnormality identified. A short interval follow-up ultrasound in 6-12 weeks to ensure resolution is suggested. 2. Otherwise normal pelvic ultrasound. Electronically Signed   By: Rise Mu M.D.   On: 11/29/2015 06:02    Procedures Procedures (including critical care time)  Medications Ordered in ED Medications  HYDROmorphone (DILAUDID) injection 1 mg (not administered)  ondansetron (ZOFRAN) injection 4 mg (not administered)  ondansetron (ZOFRAN-ODT) disintegrating tablet 8 mg (8 mg Oral Given 11/29/15 0247)  oxyCODONE-acetaminophen (PERCOCET/ROXICET) 5-325 MG per tablet 1 tablet (1 tablet Oral Given 11/29/15 0247)     Initial Impression / Assessment and Plan / ED Course  I have reviewed the triage vital signs and the nursing notes.  Pertinent labs & imaging results that were available during my care of the patient were reviewed by me and considered in my medical decision making (see chart for details).  Clinical Course  Comment By Time  History is limited as patient is moaning in pain. Will work on pain control, perform pelvic exam to further evaluate bleeding and pelvic pain.  Renne Crigler, PA-C 08/13 6433   Pelvic exam performed with RN chaperone -- Ultrasound  ordered. Pain much improved with treatment, patient now calm.  Patient updated on ultrasound results. At this time, will discharge to home. She is instructed to continue Megace as prescribed. Discharge to home with a small supply of pain medication. Discussed lab results with patient. [facility] outpatient clinic referral given.  Patient counseled on use of narcotic pain medications. Counseled not to combine these medications with others containing tylenol. Urged not to drink alcohol, drive, or perform any other activities that requires focus while taking these medications. The patient verbalizes understanding and agrees with the plan.   Final Clinical Impressions(s) / ED Diagnoses   Final diagnoses:  Abnormal uterine bleeding  Abdominal pain in female   Patient with abdominal pain, abnormal uterine bleeding, history of the same. Hemoglobin is normal. Patient with significant abdominal pain on arrival. Ultrasound ordered without any acute abnormalities other than bleeding. Remainder of lab workup is reassuring. Low  suspicion for a emergent abdominal etiology.  No signs of dehydration, patient is tolerating PO's. Lungs are clear and no signs suggestive of PNA. Low concern for appendicitis, cholecystitis, pancreatitis, ruptured viscus, UTI, kidney stone, aortic dissection, aortic aneurysm or other emergent abdominal etiology. Supportive therapy indicated with return if symptoms worsen.    New Prescriptions New Prescriptions   HYDROCODONE-ACETAMINOPHEN (NORCO/VICODIN) 5-325 MG TABLET    Take 1-2 tablets every 6 hours as needed for severe pain     Renne Crigler, PA-C 11/29/15 8295    Tomasita Crumble, MD 11/29/15 1715

## 2015-11-29 NOTE — Discharge Instructions (Signed)
Please read and follow all provided instructions.  Your diagnoses today include:  1. Abnormal uterine bleeding   2. Abdominal pain in female     Tests performed today include:  Blood counts and electrolytes  Blood tests to check liver and kidney function  Blood tests to check pancreas function  Urine test to look for infection and pregnancy (in women)  Ultrasound - no problems  Vital signs. See below for your results today.   Medications prescribed:   Vicodin (hydrocodone/acetaminophen) - narcotic pain medication  DO NOT drive or perform any activities that require you to be awake and alert because this medicine can make you drowsy. BE VERY CAREFUL not to take multiple medicines containing Tylenol (also called acetaminophen). Doing so can lead to an overdose which can damage your liver and cause liver failure and possibly death.  Take any prescribed medications only as directed.  Home care instructions:   Follow any educational materials contained in this packet.  Continue Megace as prescribed.   Follow-up instructions: Please follow-up with your primary care provider in the next 7 days for further evaluation of your symptoms.    Return instructions:  SEEK IMMEDIATE MEDICAL ATTENTION IF:  The pain does not go away or becomes severe   A temperature above 101F develops   Repeated vomiting occurs (multiple episodes)   The pain becomes localized to portions of the abdomen. The right side could possibly be appendicitis. In an adult, the left lower portion of the abdomen could be colitis or diverticulitis.   Blood is being passed in stools or vomit (bright red or black tarry stools)   You develop chest pain, difficulty breathing, dizziness or fainting, or become confused, poorly responsive, or inconsolable (young children)  If you have any other emergent concerns regarding your health  Additional Information: Abdominal (belly) pain can be caused by many things. Your  caregiver performed an examination and possibly ordered blood/urine tests and imaging (CT scan, x-rays, ultrasound). Many cases can be observed and treated at home after initial evaluation in the emergency department. Even though you are being discharged home, abdominal pain can be unpredictable. Therefore, you need a repeated exam if your pain does not resolve, returns, or worsens. Most patients with abdominal pain don't have to be admitted to the hospital or have surgery, but serious problems like appendicitis and gallbladder attacks can start out as nonspecific pain. Many abdominal conditions cannot be diagnosed in one visit, so follow-up evaluations are very important.  Your vital signs today were: BP 151/99    Pulse 61    Temp 98.2 F (36.8 C) (Oral)    Resp 16    Ht 5\' 1"  (1.549 m)    Wt 80.5 kg    LMP 11/29/2015    SpO2 100%    BMI 33.53 kg/m  If your blood pressure (bp) was elevated above 135/85 this visit, please have this repeated by your doctor within one month. --------------

## 2015-11-29 NOTE — ED Triage Notes (Signed)
The pt is c/o abd and back pain painful bms  And today she saw something coming from her vagina moving  lmp now

## 2015-11-29 NOTE — ED Triage Notes (Signed)
ABD LABS ORDERED ON THIS PT  SUDDENLY WITHDRAWAL ORDERS APPEARED  UNSURE OF THE ERROR  DISCONTINUED NON NEW  INVALID ORDERS

## 2015-11-30 LAB — GC/CHLAMYDIA PROBE AMP (~~LOC~~) NOT AT ARMC
Chlamydia: NEGATIVE
NEISSERIA GONORRHEA: NEGATIVE

## 2015-12-02 ENCOUNTER — Ambulatory Visit (INDEPENDENT_AMBULATORY_CARE_PROVIDER_SITE_OTHER): Payer: Self-pay | Admitting: Obstetrics & Gynecology

## 2015-12-02 ENCOUNTER — Other Ambulatory Visit (HOSPITAL_COMMUNITY)
Admission: RE | Admit: 2015-12-02 | Discharge: 2015-12-02 | Disposition: A | Discharge status: Home / Self Care | Payer: Self-pay | Source: Ambulatory Visit | Attending: Obstetrics & Gynecology | Admitting: Obstetrics & Gynecology

## 2015-12-02 ENCOUNTER — Encounter: Payer: Self-pay | Admitting: Obstetrics & Gynecology

## 2015-12-02 VITALS — BP 126/82 | HR 61 | Wt 173.5 lb

## 2015-12-02 DIAGNOSIS — K5900 Constipation, unspecified: Secondary | ICD-10-CM | POA: Insufficient documentation

## 2015-12-02 DIAGNOSIS — N938 Other specified abnormal uterine and vaginal bleeding: Secondary | ICD-10-CM | POA: Insufficient documentation

## 2015-12-02 DIAGNOSIS — K5901 Slow transit constipation: Secondary | ICD-10-CM

## 2015-12-02 DIAGNOSIS — Z3202 Encounter for pregnancy test, result negative: Secondary | ICD-10-CM

## 2015-12-02 LAB — POCT PREGNANCY, URINE: Preg Test, Ur: NEGATIVE

## 2015-12-02 NOTE — Progress Notes (Signed)
   Subjective:    Patient ID: Marissa [name], female    DOB: 07-24-1980, 35 y.o.   MRN: [id number]  HPI 35 yo MH P4 here today for follow up of heavy VB for about 3 months. She was given megace and that helped until she forgot to take it. She did not like it due to constipation.  Her u/s showed a somewhat large uterus but no discrete fibroids.  She reports that about 4 days ago she peed in the bathtub and thinks that she saw worms in her bathtub from her urine.   Review of Systems  She had a BTL. unemployed     Objective:   Physical Exam  [id number] Breathing, conversing nomrally  UPT negative, consent signed, time out done Cervix prepped with betadine and grasped with a single tooth tenaculum Uterus sounded to 10 cm Pipelle used for 4 passes with a moderate amount of tissue and lots of clots obtained. She tolerated the procedure well.        Assessment & Plan:  "worms" in urine- check u/s DUB- check EMBX

## 2015-12-02 NOTE — Progress Notes (Signed)
Made referral appt to [facility] for 12/04/15 11:15 per pt. Preference.

## 2015-12-11 ENCOUNTER — Encounter: Payer: Self-pay | Admitting: *Deleted

## 2015-12-18 ENCOUNTER — Encounter: Payer: Self-pay | Admitting: Obstetrics & Gynecology

## 2015-12-18 ENCOUNTER — Ambulatory Visit (INDEPENDENT_AMBULATORY_CARE_PROVIDER_SITE_OTHER): Payer: Self-pay | Admitting: Obstetrics & Gynecology

## 2015-12-18 VITALS — BP 120/69 | HR 64 | Wt 176.3 lb

## 2015-12-18 DIAGNOSIS — N939 Abnormal uterine and vaginal bleeding, unspecified: Secondary | ICD-10-CM

## 2015-12-18 MED ORDER — MEGESTROL ACETATE 20 MG PO TABS: 40.0000 mg | ORAL_TABLET | Freq: Every day | ORAL | 3 refills | Status: DC

## 2015-12-18 MED ORDER — IBUPROFEN 800 MG PO TABS: 800.0000 mg | ORAL_TABLET | Freq: Three times a day (TID) | ORAL | 3 refills | Status: DC | PRN

## 2015-12-18 NOTE — Patient Instructions (Signed)
Informacin sobre el dispositivo intrauterino (Intrauterine Device Information) Un dispositivo intrauterino (DIU) se inserta en el tero e impide el embarazo. Hay dos tipos de DIU:   DIU de cobre: este tipo de DIU est recubierto con un alambre de cobre y se inserta dentro del tero. El cobre hace que el tero y las trompas de Falopio produzcan un liquido que Federated Department Storesdestruye los espermatozoides. El DIU de cobre puede Geneticist, molecularpermanecer en el lugar durante 10 aos.  DIU con hormona: este tipo de DIU contiene la hormona progestina (progesterona sinttica). Las hormonas hacen que el moco cervical se haga ms espeso, lo que evita que el esperma ingrese al tero. Tambin hace que la membrana que recubre internamente al tero sea ms delgada lo que impide el implante del vulo fertilizado. La hormona debilita o destruye los espermatozoides que ingresan al tero. Alguno de los tipos de DIU hormonal pueden Geneticist, molecularpermanecer en el lugar durante 5 aos y otros tipos pueden dejarse en el lugar por 3 aos. El mdico se asegurar de que usted sea una buena candidata para usar el DIU. Converse con su mdico acerca de los posibles efectos secundarios.  VENTAJAS DEL DISPOSITIVO INTRAUTERINO  El DIU es muy eficaz, reversible, de accin prolongada y de bajo mantenimiento.  No hay efectos secundarios relacionados con el estrgeno.  El DIU puede ser utilizado durante la Market researcherlactancia.  No est asociado con el aumento de Pierzpeso.  Funciona inmediatamente despus de la insercin.  El DIU hormonal funciona inmediatamente si se inserta dentro de los [address] das del inicio del perodo. Ser necesario que utilice un mtodo anticonceptivo adicional durante 7 das si el DIU hormonal se inserta en algn otro momento del ciclo.  El DIU de cobre no interfiere con las hormonas femeninas.  El DIU hormonal puede hacer que los perodos menstruales abundantes se hagan ms ligeros y que haya menos clicos.  El DIU hormonal puede usarse durante 3 a 5  aos.  El DIU de cobre puede usarse durante 10 aos. DESVENTAJAS DEL DISPOSITIVO INTRAUTERINO  El DIU hormonal puede estar asociado con patrones de sangrado irregular.  El DIU de cobre puede hacer que el flujo menstrual ms abundante y doloroso.  Puede experimentar clicos y sangrado vaginal despus de la insercin.   Esta informacin no tiene Theme park managercomo fin reemplazar el consejo del mdico. Asegrese de hacerle al mdico cualquier pregunta que tenga.   Document Released: 09/22/2009 Document Revised: 12/05/2012 Elsevier Interactive Patient Education 2016 [employer]  Metrorragia funcional (Dysfunctional Uterine Bleeding) La metrorragia funcional es una hemorragia anormal proveniente del tero. La metrorragia funcional incluye estos sntomas:  Menstruacin que se adelanta o se atrasa.  Menstruacin menos o ms abundante, o con cogulos sanguneos.  Hemorragias entre los perodos Becton, Dickinson and Companymenstruales.  Ausencia de una o ms menstruaciones.  Hemorragias luego de Sales promotion account executivemantener relaciones sexuales.  Sangrado luego de la menopausia. INSTRUCCIONES PARA EL CUIDADO EN EL HOGAR  Est atenta a cualquier cambio en los sntomas. Estas indicaciones pueden ayudarla con el trastorno: Comidas  Siga una dieta equilibrada. Incluya alimentos con FedExalto contenido de hierro, como hgado, carne, Oceanographermariscos, verduras de hoja verde y Moranhuevos.  Si tiene estreimiento:  Beba abundante agua.  Consuma frutas y verduras con alto contenido de agua y McMechenfibra, North Irwincomo espinaca, zanahorias, frambuesas, manzanas y mango. Medicamentos  Baxter Internationalome los medicamentos de venta libre y los recetados solamente como se lo haya indicado el mdico.  No haga cambios en los medicamentos sin hablar con el mdico.  La aspirina o los medicamentos que la contienen  pueden aumentar la hemorragia. No tome esos medicamentos:  Durante la semana previa a Tax adviser.  Durante la [employer].  Si le recetaron comprimidos de hierro, Scientist, forensic se  lo haya indicado el mdico. Estos ayudan a Restaurant manager, fast food hierro que el organismo pierde debido a este trastorno. Actividad  Si debe cambiarse el apsito o el tampn ms de una vez cada 2horas:  Acustese con los pies elevados.  Colquese una compresa fra en la parte baja del abdomen.  Haga todo el reposo que pueda hasta que la hemorragia se detenga o disminuya.  No trate de Management consultant que la hemorragia se detenga y los niveles de hierro en la sangre se normalicen. Otras indicaciones  [employer], anote lo siguiente:  La fecha de comienzo de Tax adviser.  La fecha de su finalizacin.  Los [employer] que tiene una hemorragia anormal.  Los problemas que advierte.  Concurra a todas las visitas de control como se lo haya indicado el mdico. Esto es importante. SOLICITE ATENCIN MDICA SI:  Se siente dbil o que va a desvanecerse.  Tiene nuseas y vmitos.  No puede comer ni beber sin vomitar.  Tiene mareos o diarrea mientras toma los medicamentos.  Est tomando anticonceptivos u hormonas, y desea cambiar o suspender estos medicamentos. SOLICITE ATENCIN MDICA DE INMEDIATO SI:  Tiene escalofros o fiebre.  Debe cambiarse el apsito o el tampn ms de una vez por hora.  La hemorragia se vuelve ms abundante o el flujo menstrual contiene cogulos con ms frecuencia.  Siente dolor en el abdomen.  Pierde la conciencia.  Le aparece una erupcin cutnea.   Esta informacin no tiene Theme park manager el consejo del mdico. Asegrese de hacerle al mdico cualquier pregunta que tenga.   Document Released: 01/12/2005 Document Revised: 12/24/2014 Elsevier Interactive Patient Education [employer]

## 2015-12-18 NOTE — Progress Notes (Signed)
GYNECOLOGY VISIT NOTE  History:  35 y.o. [id number] here today for follow up after AUB evaluation. Had recent pelvic ultrasound and endometrial biopsy, here to discuss results.  Patient is Spanish-speaking only, Spanish interpreter present for this encounter. Accompanied by her husband. Reports Megestrol controls her bleeding, only has bleeding when she does not take medication. She denies any abnormal current vaginal discharge, bleeding, pelvic pain or other concerns.   Past Medical History:  Diagnosis Date  . Arthritis     Past Surgical History:  Procedure Laterality Date  . STERILIZATION  2013  . TUBAL LIGATION     The following portions of the patient's history were reviewed and updated as appropriate: allergies, current medications, past family history, past medical history, past social history, past surgical history and problem list.   Health Maintenance:  Normal pap about a couple of years ago.  Review of Systems:  Pertinent items noted in HPI and remainder of comprehensive ROS otherwise negative.  Objective:  Physical Exam BP 120/69   Pulse 64   Wt 176 lb 4.8 oz (80 kg)   LMP 11/29/2015   BMI 33.31 kg/m  CONSTITUTIONAL: Well-developed, well-nourished female in no acute distress.  HENT:  Normocephalic, atraumatic. External right and left ear normal. Oropharynx is clear and moist EYES: Conjunctivae and EOM are normal. Pupils are equal, round, and reactive to light. No scleral icterus.  NECK: Normal range of motion, supple, no masses SKIN: Skin is warm and dry. No rash noted. Not diaphoretic. No erythema. No pallor. NEUROLOGIC: Alert and oriented to person, place, and time. Normal reflexes, muscle tone coordination. No cranial nerve deficit noted. PSYCHIATRIC: Normal mood and affect. Normal behavior. Normal judgment and thought content. CARDIOVASCULAR: Normal heart rate noted RESPIRATORY: Effort and breath sounds normal, no problems with respiration noted ABDOMEN: Soft,  no distention noted.   PELVIC: Deferred MUSCULOSKELETAL: Normal range of motion. No edema noted.  Labs and Imaging Koreas Transvaginal Non-ob  Result Date: 11/29/2015 CLINICAL DATA:  Initial evaluation for acute pelvic pain. EXAM: TRANSABDOMINAL AND TRANSVAGINAL ULTRASOUND OF PELVIS TECHNIQUE: Both transabdominal and transvaginal ultrasound examinations of the pelvis were performed. Transabdominal technique was performed for global imaging of the pelvis including uterus, ovaries, adnexal regions, and pelvic cul-de-sac. It was necessary to proceed with endovaginal exam following the transabdominal exam to visualize the endometrium. COMPARISON:  None available. FINDINGS: Uterus Measurements: 11.4 x 5.7 x 5.8 cm. No fibroids or other mass visualized. Endometrium Thickness: 11.4 mm. Scattered mildly heterogeneous echogenic material within the endometrial canal, which may reflect blood products in the setting of vaginal bleeding. No definite focal abnormality identified. Right ovary Measurements: 2.8 x 1.8 x 2.9 cm. Normal appearance/no adnexal mass. Left ovary Measurements: 3.5 x 1.6 x 3.9 cm. Normal appearance/no adnexal mass. Other findings Trace free fluid within the pelvis, likely physiologic. IMPRESSION: 1. Mildly heterogeneous appearance of the endometrium/endometrial canal, which may reflect blood products given the history of vaginal bleeding. Correlation with menstrual cycle and symptomatology recommended. No definite focal abnormality identified. A short interval follow-up ultrasound in 6-12 weeks to ensure resolution is suggested. 2. Otherwise normal pelvic ultrasound. Electronically Signed   By: Rise MuBenjamin  McClintock M.D.   On: 11/29/2015 06:02   [place] Pelvis Limited  Result Date: 11/29/2015 CLINICAL DATA:  Initial evaluation for acute pelvic pain. EXAM: TRANSABDOMINAL AND TRANSVAGINAL ULTRASOUND OF PELVIS TECHNIQUE: Both transabdominal and transvaginal ultrasound examinations of the pelvis were  performed. Transabdominal technique was performed for global imaging of the pelvis including uterus, ovaries, adnexal  regions, and pelvic cul-de-sac. It was necessary to proceed with endovaginal exam following the transabdominal exam to visualize the endometrium. COMPARISON:  None available. FINDINGS: Uterus Measurements: 11.4 x 5.7 x 5.8 cm. No fibroids or other mass visualized. Endometrium Thickness: 11.4 mm. Scattered mildly heterogeneous echogenic material within the endometrial canal, which may reflect blood products in the setting of vaginal bleeding. No definite focal abnormality identified. Right ovary Measurements: 2.8 x 1.8 x 2.9 cm. Normal appearance/no adnexal mass. Left ovary Measurements: 3.5 x 1.6 x 3.9 cm. Normal appearance/no adnexal mass. Other findings Trace free fluid within the pelvis, likely physiologic. IMPRESSION: 1. Mildly heterogeneous appearance of the endometrium/endometrial canal, which may reflect blood products given the history of vaginal bleeding. Correlation with menstrual cycle and symptomatology recommended. No definite focal abnormality identified. A short interval follow-up ultrasound in 6-12 weeks to ensure resolution is suggested. 2. Otherwise normal pelvic ultrasound. Electronically Signed   By: Rise Mu M.D.   On: 11/29/2015 06:02   12/02/2015  Endometrium, biopsy - DEGENERATING SECRETORY-TYPE ENDOMETRIUM. - THERE IS NO EVIDENCE OF HYPERPLASIA OR MALIGNANCY.  Assessment & Plan:  Abnormal uterine bleeding (AUB) Results reviewed with patient, she was reassured.  Discussed management options for abnormal uterine bleeding including tranexamic acid (Lysteda), oral progesterone (Megace), Depo Provera, Progestin IUD, endometrial ablation (Novasure/Hydrothermal Ablation) or hysterectomy as definitive surgical management.  Discussed risks and benefits of each method in detail, all questions answered.   Patient desires Progestin IUD, will fill out application for  now.   Printed patient education handouts were given to the patient to review at home.  Megace and Ibuprofen prescribed as needed for now,  bleeding precautions reviewed.  Routine preventative health maintenance measures emphasized. Please refer to After Visit Summary for other counseling recommendations.   Total face-to-face time with patient: 15 minutes. Over 50% of encounter was spent on counseling and coordination of care.   Jaynie Collins, MD, FACOG Attending Obstetrician & Gynecologist, [facility] for [employer], [facility]

## 2015-12-22 DIAGNOSIS — R87619 Unspecified abnormal cytological findings in specimens from cervix uteri: Secondary | ICD-10-CM | POA: Insufficient documentation

## 2015-12-23 ENCOUNTER — Encounter: Payer: Self-pay | Admitting: Obstetrics & Gynecology

## 2016-10-03 ENCOUNTER — Other Ambulatory Visit: Payer: Self-pay | Admitting: *Deleted

## 2016-10-03 NOTE — Telephone Encounter (Signed)
Marissa [name] left a message 09/30/16 am stating she needs a refill- did not understand name of medicine she stated. States she went to pharmacy but they told her to call her doctors office.

## 2016-10-05 NOTE — Telephone Encounter (Signed)
Called patient-no answer left a message for her to call us back regarding medications.

## 2016-10-12 NOTE — Telephone Encounter (Signed)
Called Marissa [name] with Interpreter Lorinda Creedaquel Mora . She states she had called about getting more of her megace but then she went to pharmacy again and they gave her some more.   States sometimes she takes it 3 times a day when bleeding worse. I informed her the last order was for 2 tablets a day and should only take 3 times a day shortterm for increased bleeding and should be reevaluated in September because last Md visit was 12/2015. She voices understanding and has an appointment scheduled in July.

## 2016-10-27 ENCOUNTER — Ambulatory Visit: Payer: Self-pay | Admitting: Obstetrics and Gynecology

## 2016-11-30 ENCOUNTER — Encounter: Payer: Self-pay | Admitting: Obstetrics and Gynecology

## 2016-11-30 ENCOUNTER — Ambulatory Visit: Payer: Self-pay | Admitting: Obstetrics and Gynecology

## 2016-11-30 NOTE — Progress Notes (Signed)
Patient did not keep GYN appointment for 11/30/2016.  Warrene Kapfer, Jr MD Attending [facility] (Faculty Practice)   

## 2017-02-08 ENCOUNTER — Other Ambulatory Visit: Payer: Self-pay | Admitting: Obstetrics & Gynecology

## 2017-02-08 DIAGNOSIS — N939 Abnormal uterine and vaginal bleeding, unspecified: Secondary | ICD-10-CM

## 2017-03-24 ENCOUNTER — Encounter: Payer: Self-pay | Admitting: Obstetrics & Gynecology

## 2017-03-24 ENCOUNTER — Ambulatory Visit: Payer: Self-pay | Admitting: Obstetrics & Gynecology

## 2017-03-24 VITALS — BP 120/82 | HR 67 | Wt 197.4 lb

## 2017-03-24 DIAGNOSIS — R4586 Emotional lability: Secondary | ICD-10-CM

## 2017-03-24 DIAGNOSIS — N939 Abnormal uterine and vaginal bleeding, unspecified: Secondary | ICD-10-CM

## 2017-03-24 MED ORDER — MEGESTROL ACETATE 20 MG PO TABS: 40.0000 mg | ORAL_TABLET | Freq: Every day | ORAL | 10 refills | Status: DC

## 2017-03-24 NOTE — Progress Notes (Signed)
States has not had a period since last visit, but had bleeding once when ran out of pills. C/o feeling so tired in the last 2  Months, more than ever before. Patient and/or legal guardian verbally consented to meet with Behavioral Health Clinician about presenting concerns.  Given application for Liletta and instructions to bring back with proof of income( husbands pay stub) and we will help her fill out and file it. Also explained the process.  She voices understanding.

## 2017-03-24 NOTE — BH Specialist Note (Deleted)
Integrated Behavioral Health Initial Visit  MRN: [id number] Name: Marissa [name]  Number of Integrated Behavioral Health Clinician visits:: 1/6 Session Start time: ***  Session End time: *** Total time: {IBH Total Time:21014050}  Type of Service: Integrated Behavioral Health- Individual/Family Interpretor:Yes.   Interpretor Name and Language: Spanish, ***   Warm Hand Off Completed.       SUBJECTIVE: Marissa [name] is a 36 y.o. female accompanied by {CHL AMB ACCOMPANIED XL:[id number]}[phone]} Patient was referred by Dr Macon LargeAnyanwu for depression. Patient reports the following symptoms/concerns: *** Duration of problem: ***; Severity of problem: {Mild/Moderate/Severe:20260}  OBJECTIVE: Mood: {BHH MOOD:22306} and Affect: {BHH AFFECT:22307} Risk of harm to self or others: {CHL AMB BH Suicide Current Mental Status:21022748}  LIFE CONTEXT: Family and Social: *** School/Work: *** Self-Care: *** Life Changes: ***  GOALS ADDRESSED: Patient will: 1. Reduce symptoms of: {IBH Symptoms:21014056} 2. Increase knowledge and/or ability of: {IBH Patient Tools:21014057}  3. Demonstrate ability to: {IBH Goals:21014053}  INTERVENTIONS: Interventions utilized: {IBH Interventions:21014054}  Standardized Assessments completed: {IBH Screening Tools:21014051}  ASSESSMENT: Patient currently experiencing ***.   Patient may benefit from ***.  PLAN: 1. Follow up with behavioral health clinician on : *** 2. Behavioral recommendations: *** 3. Referral(s): {IBH Referrals:21014055} 4. "From scale of 1-10, how likely are you to follow plan?": ***  Marissa CloseJamie C McMannes, LCSW   Depression screen [facility] 03/24/2017  Decreased Interest 3  Down, Depressed, Hopeless 0  PHQ - 2 Score 3  Altered sleeping 3  Tired, decreased energy 3  Change in appetite 1  Feeling bad or failure about yourself  0  Moving slowly or fidgety/restless 0  Suicidal thoughts 0  PHQ-9 Score 10

## 2017-03-24 NOTE — Progress Notes (Signed)
   GYNECOLOGY OFFICE VISIT NOTE  History:  36 y.o. [id number] here today for follow up of AUB. Patient is Spanish-speaking only, Spanish interpreter present for this encounter.  Managed well on Megace, but reports feeling depressed and feeling tired. Also decreased libido. Had negative endometrial biopsy and normal scan last year.  She denies any abnormal vaginal discharge, bleeding, pelvic pain or other concerns.   Past Medical History:  Diagnosis Date  . Arthritis     Past Surgical History:  Procedure Laterality Date  . STERILIZATION  2013    The following portions of the patient's history were reviewed and updated as appropriate: allergies, current medications, past family history, past medical history, past social history, past surgical history and problem list.   Health Maintenance:  Normal pap last year; informed of upcoming free cervical cancer screening 06/12/17.  Review of Systems:  Pertinent items noted in HPI and remainder of comprehensive ROS otherwise negative.  Objective:  Physical Exam BP 120/82   Pulse 67   Wt 197 lb 6.4 oz (89.5 kg)   BMI 37.30 kg/m  CONSTITUTIONAL: Well-developed, well-nourished female in no acute distress.  HENT:  Normocephalic, atraumatic. External right and left ear normal. Oropharynx is clear and moist EYES: Conjunctivae and EOM are normal. Pupils are equal, round, and reactive to light. No scleral icterus.  NECK: Normal range of motion, supple, no masses SKIN: Skin is warm and dry. No rash noted. Not diaphoretic. No erythema. No pallor. NEUROLOGIC: Alert and oriented to person, place, and time. Normal reflexes, muscle tone coordination. No cranial nerve deficit noted. PSYCHIATRIC: Normal mood and affect. Normal behavior. Normal judgment and thought content. CARDIOVASCULAR: Normal heart rate noted RESPIRATORY: Effort and breath sounds normal, no problems with respiration noted ABDOMEN: Soft, no distention noted.   PELVIC:  Deferred MUSCULOSKELETAL: Normal range of motion. No edema noted.  Labs and Imaging No results found.  Assessment & Plan:  1. Abnormal uterine bleeding (AUB) Patient advised to try taking one pill a day to see if mood changes abate, also see if AUB is controlled. If not, will consider progestin IUD with less systemic progesterone, application done today.   - megestrol (MEGACE) 20 MG tablet; Take 2 tablets (40 mg total) by mouth daily.  Dispense: 60 tablet; Refill: 10  2. Mood changes Feels tired and depressed. Also decreased libido. Could be side effect of medication but recommended [facility].  Declined referral to Integrated Behavioral Health   Routine preventative health maintenance measures emphasized. Please refer to After Visit Summary for other counseling recommendations.   Return in about 6 weeks (around 05/05/2017) for Followup AUB.   Total face-to-face time with patient: 25 minutes.  Over 50% of encounter was spent on counseling and coordination of care.   Jaynie CollinsUGONNA  Ronnetta Currington, MD, FACOG Attending Obstetrician & Gynecologist, [facility] for [facility], [facility]

## 2017-03-24 NOTE — Patient Instructions (Addendum)
Regrese a la clinica cuando tenga su cita. Si tiene problemas o preguntas, llama a la clinica o vaya a la sala de emergencia al [facility]   [phone] para papanicolaou el 25 de febrero, 2019

## 2017-05-04 ENCOUNTER — Ambulatory Visit: Payer: Self-pay | Admitting: Obstetrics & Gynecology

## 2017-07-13 ENCOUNTER — Inpatient Hospital Stay (HOSPITAL_COMMUNITY)
Admission: AD | Admit: 2017-07-13 | Discharge: 2017-07-13 | Disposition: A | Discharge status: Home / Self Care | Payer: Self-pay | Source: Ambulatory Visit | Attending: Family Medicine | Admitting: Family Medicine

## 2017-07-13 ENCOUNTER — Encounter (HOSPITAL_COMMUNITY): Payer: Self-pay

## 2017-07-13 DIAGNOSIS — R1013 Epigastric pain: Secondary | ICD-10-CM | POA: Insufficient documentation

## 2017-07-13 DIAGNOSIS — Z87891 Personal history of nicotine dependence: Secondary | ICD-10-CM | POA: Insufficient documentation

## 2017-07-13 DIAGNOSIS — M1990 Unspecified osteoarthritis, unspecified site: Secondary | ICD-10-CM | POA: Insufficient documentation

## 2017-07-13 DIAGNOSIS — K219 Gastro-esophageal reflux disease without esophagitis: Secondary | ICD-10-CM | POA: Insufficient documentation

## 2017-07-13 DIAGNOSIS — R1032 Left lower quadrant pain: Secondary | ICD-10-CM | POA: Insufficient documentation

## 2017-07-13 DIAGNOSIS — Z825 Family history of asthma and other chronic lower respiratory diseases: Secondary | ICD-10-CM | POA: Insufficient documentation

## 2017-07-13 DIAGNOSIS — R109 Unspecified abdominal pain: Secondary | ICD-10-CM | POA: Insufficient documentation

## 2017-07-13 LAB — COMPREHENSIVE METABOLIC PANEL
ALT: 25 U/L (ref 14–54)
AST: 23 U/L (ref 15–41)
Albumin: 3.9 g/dL (ref 3.5–5.0)
Alkaline Phosphatase: 74 U/L (ref 38–126)
Anion gap: 9 (ref 5–15)
BUN: 8 mg/dL (ref 6–20)
CHLORIDE: 99 mmol/L — AB (ref 101–111)
CO2: 27 mmol/L (ref 22–32)
Calcium: 8.8 mg/dL — ABNORMAL LOW (ref 8.9–10.3)
Creatinine, Ser: 0.53 mg/dL (ref 0.44–1.00)
GFR calc Af Amer: >60 mL/min (ref >60)
Glucose, Bld: 99 mg/dL (ref 65–99)
POTASSIUM: 3.8 mmol/L (ref 3.5–5.1)
SODIUM: 135 mmol/L (ref 135–145)
Total Bilirubin: <0.1 mg/dL — ABNORMAL LOW (ref 0.3–1.2)
Total Protein: 7.6 g/dL (ref 6.5–8.1)

## 2017-07-13 LAB — CBC
HCT: 39.7 % (ref 36.0–46.0)
Hemoglobin: 13.6 g/dL (ref 12.0–15.0)
MCH: 28 pg (ref 26.0–34.0)
MCHC: 34.3 g/dL (ref 30.0–36.0)
MCV: 81.9 fL (ref 78.0–100.0)
PLATELETS: 257 10*3/uL (ref 150–400)
RBC: 4.85 MIL/uL (ref 3.87–5.11)
RDW: 14 % (ref 11.5–15.5)
WBC: 4.2 10*3/uL (ref 4.0–10.5)

## 2017-07-13 LAB — URINALYSIS, ROUTINE W REFLEX MICROSCOPIC
BACTERIA UA: NONE SEEN
BILIRUBIN URINE: NEGATIVE
Glucose, UA: NEGATIVE mg/dL
KETONES UR: NEGATIVE mg/dL
LEUKOCYTES UA: NEGATIVE
NITRITE: NEGATIVE
Protein, ur: NEGATIVE mg/dL
SPECIFIC GRAVITY, URINE: 1.002 — AB (ref 1.005–1.030)
pH: 7 (ref 5.0–8.0)

## 2017-07-13 LAB — POCT PREGNANCY, URINE: PREG TEST UR: NEGATIVE

## 2017-07-13 MED ORDER — RANITIDINE HCL 75 MG PO TABS: 75.0000 mg | ORAL_TABLET | Freq: Two times a day (BID) | ORAL | 3 refills | Status: DC

## 2017-07-13 MED ORDER — GI COCKTAIL ~~LOC~~
30.0000 mL | Freq: Once | ORAL | Status: AC
  Administered 2017-07-13: 30 mL via ORAL
  Filled 2017-07-13: qty 30

## 2017-07-13 MED ORDER — FAMOTIDINE 20 MG PO TABS
20.0000 mg | ORAL_TABLET | Freq: Once | ORAL | Status: AC
  Administered 2017-07-13: 20 mg via ORAL
  Filled 2017-07-13: qty 1

## 2017-07-13 NOTE — Discharge Instructions (Signed)
Enfermedad por reflujo gastroesofgico en los adultos (Gastroesophageal Reflux Disease, Adult) Normalmente, los alimentos descienden por el esfago y se depositan en el estmago para su digestin. Si una persona tiene enfermedad por reflujo gastroesofgico (ERGE), los alimentos y el cido estomacal regresan al esfago. Cuando esto ocurre, el esfago se irrita y se hincha (inflama). Con el tiempo, la ERGE puede provocar la formacin de pequeas perforaciones (lceras) en la mucosa del esfago. CUIDADOS EN EL HOGAR Dieta  Siga la dieta como se lo haya indicado el mdico. Tal vez deba evitar los siguientes alimentos y bebidas: ? Caf y t (con o sin cafena). ? Bebidas que contengan alcohol. ? Bebidas energizantes y deportivas. ? Gaseosas o refrescos. ? Chocolate y cacao. ? Menta y esencias de menta. ? Ajo y cebollas. ? Rbano picante. ? Alimentos muy condimentados y cidos, como pimientos, chile en polvo, curry en polvo, vinagre, salsas picantes y salsa barbacoa. ? Frutas ctricas y sus jugos, como naranjas, limones y limas. ? Alimentos a base de tomates, como salsa roja, chile, salsa y pizza con salsa roja. ? Alimentos fritos y grasos, como rosquillas, papas fritas y aderezos con alto contenido de grasa. ? Carnes con alto contenido de grasa, como hot dogs, filetes de entrecot, salchicha, jamn y tocino. ? Productos lcteos con alto contenido de grasa, como leche entera, mantequilla y queso crema.  Consuma pequeas porciones de comida con ms frecuencia. Evite consumir porciones abundantes.  Evite beber mucho lquido con las comidas.  No coma durante las 2 o 3horas previas a la hora de acostarse.  No se acueste inmediatamente despus de comer.  No haga actividad fsica enseguida despus de comer. Instrucciones generales  Est atento a cualquier cambio en los sntomas.  Tome los medicamentos de venta libre y los recetados solamente como se lo haya indicado el mdico. No tome  aspirina, ibuprofeno ni otros antiinflamatorios no esteroides (AINE), a menos que el mdico lo autorice.  No consuma ningn producto que contenga tabaco, lo que incluye cigarrillos, tabaco de mascar y cigarrillos electrnicos. Si necesita ayuda para dejar de fumar, consulte al mdico.  Use ropa suelta. No use nada ajustado alrededor de la cintura.  Levante (eleve) unas 6pulgadas (15centmetros) la cabecera de la cama.  Intente bajar el nivel de estrs. Si necesita ayuda para hacerlo, consulte al mdico.  Si tiene sobrepeso, adelgace hasta alcanzar un peso saludable. Pregntele a su mdico cmo puede perder peso de manera segura.  Concurra a todas las visitas de control como se lo haya indicado el mdico. Esto es importante. SOLICITE AYUDA SI:  Aparecen nuevos sntomas.  Baja de peso y no sabe por qu.  Tiene dificultad para tragar o siente dolor al hacerlo.  Tiene sibilancias o tos que no desaparece.  Los sntomas no mejoran con el tratamiento.  Tiene la voz ronca. SOLICITE AYUDA DE INMEDIATO SI:  Tiene dolor en los brazos, el cuello, los maxilares, la dentadura o la espalda.  Transpira, se marea o tiene sensacin de desvanecimiento.  Siente falta de aire o dolor en el pecho.  Vomita y el vmito es parecido a la sangre o a los granos de caf.  Pierde el conocimiento (se desmaya).  Las heces son sanguinolentas o de color negro.  No puede tragar, beber o comer. Esta informacin no tiene como fin reemplazar el consejo del mdico. Asegrese de hacerle al mdico cualquier pregunta que tenga. Document Released: 05/07/2010 Document Revised: 12/24/2014 Document Reviewed: 07/30/2014 Elsevier Interactive Patient Education  2018 Elsevier Inc.  

## 2017-07-13 NOTE — MAU Note (Signed)
Upper abdominal pain that started 3 days ago, pain gets worse after eating.  Concerned because she stopped taking her megace 3 months ago because she stopped bleeding and thinks it could have to do with that.  Took 1 amoxicillin for the pain because she thought it was an infection.  Currently having spotting that started today.  States the pain does not feel like menstrual cramps.

## 2017-07-13 NOTE — MAU Provider Note (Addendum)
Chief Complaint:  Abdominal Pain   First Provider Initiated Contact with Patient 07/13/17 2117       HPI: Marissa [name] is a 37 y.o. [id number] who presents to maternity admissions reporting epigastric pain for 3 days. Worse after eating. Worried that stopping Megace 3 months ago has caused it.  States not bleeding much since stopping Megace.  Has not tried TUMS or any other med other than antibiotic. She reports no vaginal bleeding, vaginal itching/burning, urinary symptoms, h/a, dizziness, n/v, or fever/chills.  Refuses interpretor for me.   Had required one in prior visits but insists her English is good and does not need interpretor.    Abdominal Pain  This is a new problem. The current episode started in the past 7 days. The onset quality is gradual. The problem occurs intermittently. The problem has been unchanged. The pain is located in the epigastric region. The quality of the pain is cramping and burning. The abdominal pain does not radiate. Pertinent negatives include no anorexia, constipation, diarrhea, dysuria, fever, myalgias, nausea or vomiting. The pain is aggravated by eating. The pain is relieved by nothing. She has tried antibiotics for the symptoms. The treatment provided no relief.    RN Note: Upper abdominal pain that started 3 days ago, pain gets worse after eating.  Concerned because she stopped taking her megace 3 months ago because she stopped bleeding and thinks it could have to do with that.  Took 1 amoxicillin for the pain because she thought it was an infection.  Currently having spotting that started today.  States the pain does not feel like menstrual cramps.     Past Medical History: Past Medical History:  Diagnosis Date  . Arthritis     Past obstetric history: OB History  Gravida Para Term Preterm AB Living  5 4 4   1 4   SAB TAB Ectopic Multiple Live Births  1            # Outcome Date GA Lbr Len/2nd Weight Sex Delivery Anes PTL Lv  5 SAB            4 Term           3 Term           2 Term           1 Term             Past Surgical History: Past Surgical History:  Procedure Laterality Date  . STERILIZATION  2013    Family History: Family History  Problem Relation Age of Onset  . Asthma Mother   . Hyperlipidemia Mother   . Cancer Father     Social History: Social History   Tobacco Use  . Smoking status: Never Smoker  . Smokeless tobacco: Never Used  . Tobacco comment: smoked for 1 year over 3 years ago  Substance Use Topics  . Alcohol use: No  . Drug use: No    Allergies: No Known Allergies  Meds:  Medications Prior to Admission  Medication Sig Dispense Refill Last Dose  . hydrocortisone (ANUSOL-HC) 25 MG suppository Place 25 mg rectally 2 (two) times daily.   More than a month at Unknown time  . ibuprofen (ADVIL,MOTRIN) 800 MG tablet Take 1 tablet (800 mg total) by mouth 3 (three) times daily with meals as needed for headache or moderate pain. (Patient not taking: Reported on 03/24/2017) 30 tablet 3 Not Taking  . megestrol (MEGACE) 20 MG tablet Take 2 tablets (  40 mg total) by mouth daily. 60 tablet 10 More than a month at Unknown time    I have reviewed patient's Past Medical Hx, Surgical Hx, Family Hx, Social Hx, medications and allergies.  ROS:  Review of Systems  Constitutional: Negative for fever.  Gastrointestinal: Positive for abdominal pain. Negative for anorexia, constipation, diarrhea, nausea and vomiting.  Genitourinary: Negative for dysuria.  Musculoskeletal: Negative for myalgias.   Other systems negative     Physical Exam   Patient Vitals for the past 24 hrs:  BP Temp Pulse Resp SpO2 Height Weight  07/13/17 2027 124/84 98.8 F (37.1 C) 66 17 100 % 5\' 2"  (1.575 m) 197 lb 4 oz (89.5 kg)   Constitutional: Well-developed, well-nourished female in no acute distress.  Cardiovascular: normal rate and rhythm Respiratory: normal effort, no distress.  GI: Abd soft, non-tender, except over  epigastrum.  Nondistended.  No rebound, No guarding.   MS: Extremities nontender, no edema, normal ROM Neurologic: Alert and oriented x 4.   Grossly nonfocal. GU: Neg CVAT. Skin:  Warm and Dry Psych:  Affect appropriate.  PELVIC EXAM: deferred   Labs: Results for orders placed or performed during the hospital encounter of 07/13/17 (from the past 24 hour(s))  Urinalysis, Routine w reflex microscopic     Status: Abnormal   Collection Time: 07/13/17  8:35 PM  Result Value Ref Range   Color, Urine COLORLESS (A) YELLOW   APPearance CLEAR CLEAR   Specific Gravity, Urine 1.002 (L) 1.005 - 1.030   pH 7.0 5.0 - 8.0   Glucose, UA NEGATIVE NEGATIVE mg/dL   Hgb urine dipstick MODERATE (A) NEGATIVE   Bilirubin Urine NEGATIVE NEGATIVE   Ketones, ur NEGATIVE NEGATIVE mg/dL   Protein, ur NEGATIVE NEGATIVE mg/dL   Nitrite NEGATIVE NEGATIVE   Leukocytes, UA NEGATIVE NEGATIVE   RBC / HPF 0-5 0 - 5 RBC/hpf   WBC, UA 0-5 0 - 5 WBC/hpf   Bacteria, UA NONE SEEN NONE SEEN   Squamous Epithelial / LPF 0-5 (A) NONE SEEN   Mucus PRESENT   Pregnancy, urine POC     Status: None   Collection Time: 07/13/17  8:42 PM  Result Value Ref Range   Preg Test, Ur NEGATIVE NEGATIVE      Imaging:  No results found.  MAU Course/MDM: I have ordered labs as follows: see above Imaging ordered: none Results reviewed.    Treatments in MAU included GI cocktail which reduced her pain, but did not totally eliminate it. One dose of Pepcid given Discussed probable GERD as etiology.  Recommend avoid high fat or spicy food and use acid reducer for a month   WIll refer to [facility] for ongoing care.   Pt stable at time of discharge.  Assessment: Epigastric pain - Plan: Discharge patient  Gastroesophageal reflux disease without esophagitis - Plan: Discharge patient  Abdominal pain, left lower quadrant - Plan: Discharge patient  Plan: Discharge home Recommend GERD diet.  Referred to clinics for  medical care Rx sent for Zantac 75 for GERD  Encouraged to return here or to other Urgent Care/ED if she develops worsening of symptoms, increase in pain, fever, or other concerning symptoms.   Wynelle BourgeoisMarie Demerius Podolak CNM, MSN Certified Nurse-Midwife 07/13/2017 9:17 PM

## 2017-08-02 ENCOUNTER — Other Ambulatory Visit: Payer: Self-pay | Admitting: Gastroenterology

## 2017-08-02 DIAGNOSIS — R1013 Epigastric pain: Secondary | ICD-10-CM

## 2017-08-02 DIAGNOSIS — R112 Nausea with vomiting, unspecified: Secondary | ICD-10-CM

## 2017-08-10 ENCOUNTER — Other Ambulatory Visit: Payer: Self-pay

## 2017-09-08 ENCOUNTER — Ambulatory Visit: Payer: Self-pay | Admitting: Internal Medicine

## 2017-10-12 ENCOUNTER — Other Ambulatory Visit: Payer: Self-pay

## 2017-11-05 ENCOUNTER — Other Ambulatory Visit: Payer: Self-pay

## 2017-11-05 ENCOUNTER — Encounter (HOSPITAL_COMMUNITY): Payer: Self-pay | Admitting: Emergency Medicine

## 2017-11-05 ENCOUNTER — Ambulatory Visit (HOSPITAL_COMMUNITY)
Admission: EM | Admit: 2017-11-05 | Discharge: 2017-11-05 | Disposition: A | Discharge status: Home / Self Care | Payer: Self-pay | Attending: Family Medicine | Admitting: Family Medicine

## 2017-11-05 DIAGNOSIS — R21 Rash and other nonspecific skin eruption: Secondary | ICD-10-CM

## 2017-11-05 MED ORDER — PREDNISONE 50 MG PO TABS: 50.0000 mg | ORAL_TABLET | Freq: Every day | ORAL | 0 refills | Status: AC

## 2017-11-05 MED ORDER — METHYLPREDNISOLONE SODIUM SUCC 125 MG IJ SOLR
INTRAMUSCULAR | Status: AC
  Filled 2017-11-05: qty 2

## 2017-11-05 MED ORDER — METHYLPREDNISOLONE SODIUM SUCC 125 MG IJ SOLR
125.0000 mg | Freq: Once | INTRAMUSCULAR | Status: AC
  Administered 2017-11-05: 125 mg via INTRAMUSCULAR

## 2017-11-05 NOTE — Discharge Instructions (Signed)
As discussed, rash could be due to reaction to the bug bite.  Solu-Medrol injection in office today.  Start prednisone as directed.  You can take over-the-counter allergy medicine such as Zyrtec, Claritin, Allegra to help with itching.  Monitor for spreading redness, increased warmth, fever, pain, follow-up for reevaluation needed.

## 2017-11-05 NOTE — ED Triage Notes (Signed)
[facility] a [facility] systemic rash x 3 days.

## 2017-11-05 NOTE — ED Provider Notes (Signed)
[facility]    CSN: [id number] Arrival date & time: 11/05/17  1142     History   Chief Complaint Chief Complaint  Patient presents with  . Rash    HPI Marissa [name] is a 37 y.o. female.   37 year old female comes in for  3-day history of rash.  States first started out as a insect bite to the thigh, and now with rash to the bilateral upper and lower extremity, as well as back.  Rash is itching in nature, denies pain.  Denies spreading erythema, increased warmth.  Denies fever, chills, night sweats.  Denies any new hygiene product.  Has been taking Zyrtec without relief.     Past Medical History:  Diagnosis Date  . Arthritis     Patient Active Problem List   Diagnosis Date Noted  . Abnormal uterine bleeding (AUB) 12/18/2015  . Constipation 12/02/2015  . Weight gain 09/26/2013  . Amenorrhea 09/26/2013  . Abdominal pain, left lower quadrant 09/26/2013    Past Surgical History:  Procedure Laterality Date  . STERILIZATION  2013    OB History    Gravida  5   Para  4   Term  4   Preterm      AB  1   Living  4     SAB  1   TAB      Ectopic      Multiple      Live Births               Home Medications    Prior to Admission medications   Medication Sig Start Date End Date Taking? Authorizing Provider  megestrol (MEGACE) 20 MG tablet Take 2 tablets (40 mg total) by mouth daily. 03/24/17  Yes Anyanwu, Jethro Bastos, MD  ranitidine (ZANTAC 75) 75 MG tablet Take 1 tablet (75 mg total) by mouth 2 (two) times daily. 07/13/17  Yes Aviva Signs, CNM  predniSONE (DELTASONE) 50 MG tablet Take 1 tablet (50 mg total) by mouth daily for 5 days. 11/05/17 11/10/17  Belinda Fisher, PA-C    Family History Family History  Problem Relation Age of Onset  . Asthma Mother   . Hyperlipidemia Mother   . Cancer Father     Social History Social History   Tobacco Use  . Smoking status: Never Smoker  . Smokeless tobacco: Never Used  . Tobacco  comment: smoked for 1 year over 3 years ago  Substance Use Topics  . Alcohol use: No  . Drug use: No     Allergies   Patient has no known allergies.   Review of Systems Review of Systems  Reason unable to perform ROS: See HPI as above.     Physical Exam Triage Vital Signs ED Triage Vitals  Enc Vitals Group     BP 11/05/17 1203 124/76     Pulse Rate 11/05/17 1203 69     Resp 11/05/17 1203 18     Temp 11/05/17 1203 99.4 F (37.4 C)     Temp Source 11/05/17 1203 Oral     SpO2 11/05/17 1203 100 %     Weight --      Height --      Head Circumference --      Peak Flow --      Pain Score 11/05/17 1202 0     Pain Loc --      Pain Edu? --      Excl. in GC? --  No data found.  Updated Vital Signs BP 124/76 (BP Location: Left Arm)   Pulse 69   Temp 99.4 F (37.4 C) (Oral)   Resp 18   SpO2 100%   Physical Exam  Constitutional: She is oriented to person, place, and time. She appears well-developed and well-nourished. No distress.  HENT:  Head: Normocephalic and atraumatic.  Eyes: Pupils are equal, round, and reactive to light. Conjunctivae are normal.  Neurological: She is alert and oriented to person, place, and time.  Skin: Skin is warm and dry. She is not diaphoretic.  See picture below. No increased warmth. No tenderness to palpation. No fluctuance felt.          UC Treatments / Results  Labs (all labs ordered are listed, but only abnormal results are displayed) Labs Reviewed - No data to display  EKG None  Radiology No results found.  Procedures Procedures (including critical care time)  Medications Ordered in UC Medications  methylPREDNISolone sodium succinate (SOLU-MEDROL) 125 mg/2 mL injection 125 mg (125 mg Intramuscular Given 11/05/17 1312)    Initial Impression / Assessment and Plan / UC Course  I have reviewed the triage vital signs and the nursing notes.  Pertinent labs & imaging results that were available during my care of the  patient were reviewed by me and considered in my medical decision making (see chart for details).    Discussed case with Dr Delton SeeNelson. Solumedrol injection in office today. Will start prednisone as directed. Other symptomatic treatment as directed. Return precautions given. Patient expresses understanding and agrees to plan.  Final Clinical Impressions(s) / UC Diagnoses   Final diagnoses:  Rash    ED Prescriptions    Medication Sig Dispense Auth. Provider   predniSONE (DELTASONE) 50 MG tablet Take 1 tablet (50 mg total) by mouth daily for 5 days. 5 tablet Threasa AlphaYu, Sharrell Krawiec V, PA-C        Earnestene Angello V, [place]-C 11/05/17 1326

## 2018-04-16 ENCOUNTER — Ambulatory Visit (INDEPENDENT_AMBULATORY_CARE_PROVIDER_SITE_OTHER): Payer: Self-pay | Admitting: Obstetrics and Gynecology

## 2018-04-16 ENCOUNTER — Encounter: Payer: Self-pay | Admitting: Obstetrics and Gynecology

## 2018-04-16 VITALS — BP 141/82 | HR 66 | Wt 200.9 lb

## 2018-04-16 DIAGNOSIS — N912 Amenorrhea, unspecified: Secondary | ICD-10-CM

## 2018-04-16 LAB — POCT URINALYSIS DIP (DEVICE)
BILIRUBIN URINE: NEGATIVE
Glucose, UA: NEGATIVE mg/dL
Hgb urine dipstick: NEGATIVE
KETONES UR: NEGATIVE mg/dL
Nitrite: NEGATIVE
Protein, ur: NEGATIVE mg/dL
Specific Gravity, Urine: 1.015 (ref 1.005–1.030)
Urobilinogen, UA: 0.2 mg/dL (ref 0.0–1.0)
pH: 8.5 — ABNORMAL HIGH (ref 5.0–8.0)

## 2018-04-16 LAB — POCT PREGNANCY, URINE: PREG TEST UR: NEGATIVE

## 2018-04-16 MED ORDER — MEDROXYPROGESTERONE ACETATE 10 MG PO TABS: ORAL_TABLET | ORAL | 3 refills | Status: DC

## 2018-04-16 NOTE — Progress Notes (Signed)
Obstetrics and Gynecology Visit Return Patient Evaluation  Appointment Date: 04/16/2018  [facility]  Chief Complaint: amenorrhea, back-belly pain  History of Present Illness:  Marissa [name] is a 37 y.o. G5P4 (LMP: late august) with the above CC. Patient was on qday megace for AUB and she states she was amenorrheic on it but she stopped it to have a period. She states she had one about two weeks later and that is when the pain started. No bleeding since that LMP and pain comes and goes and feel similar to the past when she had amenorrhea. [place] for evaluation  She had a negative 2017 embx Review of Systems:  as noted in the History of Present Illness.  Patient Active Problem List   Diagnosis Date Noted  . Abnormal uterine bleeding (AUB) 12/18/2015  . Constipation 12/02/2015  . Weight gain 09/26/2013  . Amenorrhea 09/26/2013  . Abdominal pain, left lower quadrant 09/26/2013   Medications:  Marissa [name] had no medications administered during this visit. Current Outpatient Medications  Medication Sig Dispense Refill  . ranitidine (ZANTAC 75) 75 MG tablet Take 1 tablet (75 mg total) by mouth 2 (two) times daily. (Patient not taking: Reported on 04/16/2018) 60 tablet 3   No current facility-administered medications for this visit.     Allergies: has No Known Allergies.  Physical Exam:  BP (!) 141/82   Pulse 66   Wt 200 lb 14.4 oz (91.1 kg)   BMI 36.75 kg/m  Body mass index is 36.75 kg/m. General appearance: Well nourished, well developed female in no acute distress.  Abdomen: diffusely non tender to palpation, non distended, and no masses, hernias Neuro/Psych:  Normal mood and affect.    Pelvic exam:  EGBUS, vaginal vault and cervix: within normal limits Uterus: small, mobile Adnexa: negative  Labs: UPT and U/A negative   Assessment: amenorrhea.   Plan:  1.  Amenorrhea D/w her that I recommend she do the provera 10mg  qday x 14d per month to have a regular period. I told her that the first one may be heavy and painful but after that should be regular and not too heavy or painful. I told her that she needs to have regular withdrawal periods, if she isn't on hormones to decrease AUB and bleeding risk. If cyclic provera doesn't work, then I'd recommend doing PCOS work up with u/s and blood work. Patient is self pay and could consider Mirena or depo provera in future PRN  Interpreter used.    RTC: PRN  Cornelia Copaharlie Mylinda Brook, Jr MD Attending [facility](Faculty Practice)

## 2018-04-16 NOTE — Progress Notes (Signed)
Spanish Interpreter Mariel Gallego  Pt given information for Free Pap Screening

## 2018-05-26 ENCOUNTER — Encounter (HOSPITAL_COMMUNITY): Payer: Self-pay | Admitting: Emergency Medicine

## 2018-05-26 ENCOUNTER — Ambulatory Visit (HOSPITAL_COMMUNITY)
Admission: EM | Admit: 2018-05-26 | Discharge: 2018-05-26 | Disposition: A | Discharge status: Home / Self Care | Payer: Self-pay | Attending: Family Medicine | Admitting: Family Medicine

## 2018-05-26 DIAGNOSIS — J111 Influenza due to unidentified influenza virus with other respiratory manifestations: Secondary | ICD-10-CM

## 2018-05-26 DIAGNOSIS — R69 Illness, unspecified: Secondary | ICD-10-CM

## 2018-05-26 MED ORDER — ACETAMINOPHEN 325 MG PO TABS
ORAL_TABLET | ORAL | Status: AC
  Filled 2018-05-26: qty 2

## 2018-05-26 MED ORDER — ACETAMINOPHEN 325 MG PO TABS
650.0000 mg | ORAL_TABLET | Freq: Once | ORAL | Status: AC
  Administered 2018-05-26: 650 mg via ORAL

## 2018-05-26 MED ORDER — ONDANSETRON 4 MG PO TBDP: 4.0000 mg | ORAL_TABLET | Freq: Three times a day (TID) | ORAL | 0 refills | Status: DC | PRN

## 2018-05-26 MED ORDER — IBUPROFEN 600 MG PO TABS: 600.0000 mg | ORAL_TABLET | Freq: Four times a day (QID) | ORAL | 0 refills | Status: DC | PRN

## 2018-05-26 NOTE — ED Triage Notes (Signed)
Pt here for flu sx x 3 days with  Fever

## 2018-05-26 NOTE — Discharge Instructions (Signed)
Symptoms likely flu/gripe. Typically lasts 1 week approximately. Please rest, drink plenty of fluids and use recommendations below to further control your symptoms in the meantime  1. Take a daily allergy pill/anti-histamine like Zyrtec, Claritin, or Store brand consistently for 2 weeks  2. For congestion you may try an oral decongestant like Mucinex or sudafed. You may also try intranasal flonase nasal spray or saline irrigations (neti pot, sinus cleanse)  3. For your sore throat you may try cepacol lozenges, salt water gargles, throat spray. Treatment of congestion may also help your sore throat.  4. For cough you may try Delsym,  Robitussen, Mucinex DM  5. Take Tylenol or Ibuprofen to help with pain/inflammation  6. Stay hydrated, drink plenty of fluids to keep throat coated and less irritated  Honey Tea For cough/sore throat try using a honey-based tea. Use 3 teaspoons of honey with juice squeezed from half lemon. Place shaved pieces of ginger into 1/2-1 cup of water and warm over stove top. Then mix the ingredients and repeat every 4 hours as needed.

## 2018-05-27 NOTE — ED Provider Notes (Signed)
[facility]    CSN: [id number] Arrival date & time: 05/26/18  1303     History   Chief Complaint Chief Complaint  Patient presents with  . Influenza    HPI Marissa [name] is a 38 y.o. female no contributing past medical history presenting today for evaluation of URI symptoms.  Patient states that she has had cough, body aches, headaches, sore throat.  Symptoms began 3 days ago.  She is also noted fever.  She has not been taking Tylenol or ibuprofen.  She has tried Mucinex for the nasal congestion.  She has had some mild nausea with the cough.  Decreased appetite and oral intake.  Denies chest pain or shortness of breath.  HPI  Past Medical History:  Diagnosis Date  . Arthritis     Patient Active Problem List   Diagnosis Date Noted  . Abnormal uterine bleeding (AUB) 12/18/2015  . Constipation 12/02/2015  . Weight gain 09/26/2013  . Amenorrhea 09/26/2013  . Abdominal pain, left lower quadrant 09/26/2013    Past Surgical History:  Procedure Laterality Date  . STERILIZATION  2013   BTL    OB History    Gravida  5   Para  4   Term  4   Preterm      AB  1   Living  4     SAB  1   TAB      Ectopic      Multiple      Live Births           Obstetric Comments  All vaginal         Home Medications    Prior to Admission medications   Medication Sig Start Date End Date Taking? Authorizing Provider  ibuprofen (ADVIL,MOTRIN) 600 MG tablet Take 1 tablet (600 mg total) by mouth every 6 (six) hours as needed. 05/26/18   Mellissa Conley C, PA-C  medroxyPROGESTERone (PROVERA) 10 MG tablet One tab po qday x 14 days each month. You can expect to have a period within a few days of stopping the pill 04/16/18   [place] Bing, MD  ondansetron (ZOFRAN ODT) 4 MG disintegrating tablet Take 1 tablet (4 mg total) by mouth every 8 (eight) hours as needed for nausea or vomiting. 05/26/18   Malika Demario C, PA-C  ranitidine (ZANTAC 75) 75 MG tablet  Take 1 tablet (75 mg total) by mouth 2 (two) times daily. Patient not taking: Reported on 04/16/2018 07/13/17   Aviva Signs, CNM    Family History Family History  Problem Relation Age of Onset  . Asthma Mother   . Hyperlipidemia Mother   . Cancer Father     Social History Social History   Tobacco Use  . Smoking status: Never Smoker  . Smokeless tobacco: Never Used  . Tobacco comment: smoked for 1 year over 3 years ago  Substance Use Topics  . Alcohol use: No  . Drug use: No     Allergies   Patient has no known allergies.   Review of Systems Review of Systems  Constitutional: Positive for appetite change, chills, fatigue and fever. Negative for activity change.  HENT: Positive for congestion, rhinorrhea and sore throat. Negative for ear pain, sinus pressure and trouble swallowing.   Eyes: Negative for discharge and redness.  Respiratory: Positive for cough. Negative for chest tightness and shortness of breath.   Cardiovascular: Negative for chest pain.  Gastrointestinal: Negative for abdominal pain, diarrhea, nausea and vomiting.  Musculoskeletal: Negative for myalgias.  Skin: Negative for rash.  Neurological: Negative for dizziness, light-headedness and headaches.     Physical Exam Triage Vital Signs ED Triage Vitals [05/26/18 1404]  Enc Vitals Group     BP (!) 152/102     Pulse Rate 96     Resp 18     Temp (!) 101.2 F (38.4 C)     Temp Source Oral     SpO2 95 %     Weight      Height      Head Circumference      Peak Flow      Pain Score 4     Pain Loc      Pain Edu?      Excl. in GC?    No data found.  Updated Vital Signs BP (!) 152/102 (BP Location: Right Arm)   Pulse 96   Temp (!) 101.2 F (38.4 C) (Oral)   Resp 18   SpO2 95%   Visual Acuity Right Eye Distance:   Left Eye Distance:   Bilateral Distance:    Right Eye Near:   Left Eye Near:    Bilateral Near:     Physical Exam Vitals signs and nursing note reviewed.    Constitutional:      General: She is not in acute distress.    Appearance: She is well-developed.  HENT:     Head: Normocephalic and atraumatic.     Ears:     Comments: Bilateral ears without tenderness to palpation of external auricle, tragus and mastoid, EAC's without erythema or swelling, TM's with good bony landmarks and cone of light. Non erythematous.    Mouth/Throat:     Comments: Oral mucosa pink and moist, no tonsillar enlargement or exudate. Posterior pharynx patent and nonerythematous, no uvula deviation or swelling. Normal phonation. Eyes:     Conjunctiva/sclera: Conjunctivae normal.  Neck:     Musculoskeletal: Neck supple.  Cardiovascular:     Rate and Rhythm: Normal rate and regular rhythm.     Heart sounds: No murmur.  Pulmonary:     Effort: Pulmonary effort is normal. No respiratory distress.     Breath sounds: Normal breath sounds.     Comments: Breathing comfortably at rest, CTABL, no wheezing, rales or other adventitious sounds auscultated Abdominal:     Palpations: Abdomen is soft.     Tenderness: There is no abdominal tenderness.  Skin:    General: Skin is warm and dry.  Neurological:     Mental Status: She is alert.      UC Treatments / Results  Labs (all labs ordered are listed, but only abnormal results are displayed) Labs Reviewed - No data to display  EKG None  Radiology No results found.  Procedures Procedures (including critical care time)  Medications Ordered in UC Medications  acetaminophen (TYLENOL) tablet 650 mg (650 mg Oral Given 05/26/18 1410)    Initial Impression / Assessment and Plan / UC Course  I have reviewed the triage vital signs and the nursing notes.  Pertinent labs & imaging results that were available during my care of the patient were reviewed by me and considered in my medical decision making (see chart for details).     Patient most likely with influenza-like illness, fever and acute onset of URI symptoms with  body aches and nausea.  Patient outside of Tamiflu window, will recommend symptomatic and supportive care as likely viral etiology.  Throat normal, do not suspect strep.  Fever management with Tylenol and ibuprofen.  Push fluids, rest, continue to monitor temperature, breathing,Discussed strict return precautions. Patient verbalized understanding and is agreeable with plan.  Final Clinical Impressions(s) / UC Diagnoses   Final diagnoses:  Influenza-like illness     Discharge Instructions     Symptoms likely flu/gripe. Typically lasts 1 week approximately. Please rest, drink plenty of fluids and use recommendations below to further control your symptoms in the meantime  1. Take a daily allergy pill/anti-histamine like Zyrtec, Claritin, or Store brand consistently for 2 weeks  2. For congestion you may try an oral decongestant like Mucinex or sudafed. You may also try intranasal flonase nasal spray or saline irrigations (neti pot, sinus cleanse)  3. For your sore throat you may try cepacol lozenges, salt water gargles, throat spray. Treatment of congestion may also help your sore throat.  4. For cough you may try Delsym,  Robitussen, Mucinex DM  5. Take Tylenol or Ibuprofen to help with pain/inflammation  6. Stay hydrated, drink plenty of fluids to keep throat coated and less irritated  Honey Tea For cough/sore throat try using a honey-based tea. Use 3 teaspoons of honey with juice squeezed from half lemon. Place shaved pieces of ginger into 1/2-1 cup of water and warm over stove top. Then mix the ingredients and repeat every 4 hours as needed.    ED Prescriptions    Medication Sig Dispense Auth. Provider   ondansetron (ZOFRAN ODT) 4 MG disintegrating tablet Take 1 tablet (4 mg total) by mouth every 8 (eight) hours as needed for nausea or vomiting. 20 tablet Opaline Reyburn C, PA-C   ibuprofen (ADVIL,MOTRIN) 600 MG tablet Take 1 tablet (600 mg total) by mouth every 6 (six) hours as  needed. 30 tablet Skarlett Sedlacek, [place] C, PA-C     Controlled Substance Prescriptions [place] Controlled Substance Registry consulted? Not Applicable   Lew DawesWieters, Kathrina Crosley C, [place]-C 05/27/18 0900

## 2018-07-03 ENCOUNTER — Encounter: Payer: Self-pay | Admitting: *Deleted

## 2018-11-21 ENCOUNTER — Encounter: Payer: Self-pay | Admitting: Obstetrics and Gynecology

## 2018-12-07 ENCOUNTER — Ambulatory Visit (INDEPENDENT_AMBULATORY_CARE_PROVIDER_SITE_OTHER): Payer: Self-pay | Admitting: Obstetrics and Gynecology

## 2018-12-07 ENCOUNTER — Other Ambulatory Visit: Payer: Self-pay

## 2018-12-07 ENCOUNTER — Encounter: Payer: Self-pay | Admitting: Obstetrics and Gynecology

## 2018-12-07 ENCOUNTER — Other Ambulatory Visit (HOSPITAL_COMMUNITY)
Admission: RE | Admit: 2018-12-07 | Discharge: 2018-12-07 | Disposition: A | Discharge status: Home / Self Care | Payer: Self-pay | Source: Ambulatory Visit | Attending: Obstetrics and Gynecology | Admitting: Obstetrics and Gynecology

## 2018-12-07 VITALS — BP 112/72 | HR 72 | Temp 97.4°F | Resp 12 | Ht 61.5 in | Wt 196.6 lb

## 2018-12-07 DIAGNOSIS — Z124 Encounter for screening for malignant neoplasm of cervix: Secondary | ICD-10-CM

## 2018-12-07 DIAGNOSIS — N921 Excessive and frequent menstruation with irregular cycle: Secondary | ICD-10-CM

## 2018-12-07 DIAGNOSIS — Z0000 Encounter for general adult medical examination without abnormal findings: Secondary | ICD-10-CM

## 2018-12-07 DIAGNOSIS — Z01419 Encounter for gynecological examination (general) (routine) without abnormal findings: Secondary | ICD-10-CM

## 2018-12-07 NOTE — Patient Instructions (Signed)
EXERCISE AND DIET:  We recommended that you start or continue a regular exercise program for good health. Regular exercise means any activity that makes your heart beat faster and makes you sweat.  We recommend exercising at least 30 minutes per day at least 3 days a week, preferably 4 or 5.  We also recommend a diet low in fat and sugar.  Inactivity, poor dietary choices and obesity can cause diabetes, heart attack, stroke, and kidney damage, among others.    ALCOHOL AND SMOKING:  Women should limit their alcohol intake to no more than 7 drinks/beers/glasses of wine (combined, not each!) per week. Moderation of alcohol intake to this level decreases your risk of breast cancer and liver damage. And of course, no recreational drugs are part of a healthy lifestyle.  And absolutely no smoking or even second hand smoke. Most people know smoking can cause heart and lung diseases, but did you know it also contributes to weakening of your bones? Aging of your skin?  Yellowing of your teeth and nails?  CALCIUM AND VITAMIN D:  Adequate intake of calcium and Vitamin D are recommended.  The recommendations for exact amounts of these supplements seem to change often, but generally speaking 1,000 mg of calcium (between diet and supplement) and 800 units of Vitamin D per day seems prudent. Certain women may benefit from higher intake of Vitamin D.  If you are among these women, your doctor will have told you during your visit.    PAP SMEARS:  Pap smears, to check for cervical cancer or precancers,  have traditionally been done yearly, although recent scientific advances have shown that most women can have pap smears less often.  However, every woman still should have a physical exam from her gynecologist every year. It will include a breast check, inspection of the vulva and vagina to check for abnormal growths or skin changes, a visual exam of the cervix, and then an exam to evaluate the size and shape of the uterus and  ovaries.  And after 37 years of age, a rectal exam is indicated to check for rectal cancers. We will also provide age appropriate advice regarding health maintenance, like when you should have certain vaccines, screening for sexually transmitted diseases, bone density testing, colonoscopy, mammograms, etc.   MAMMOGRAMS:  All women over 37 years old should have a yearly mammogram. Many facilities now offer a "3D" mammogram, which may cost around $50 extra out of pocket. If possible,  we recommend you accept the option to have the 3D mammogram performed.  It both reduces the number of women who will be called back for extra views which then turn out to be normal, and it is better than the routine mammogram at detecting truly abnormal areas.    COLON CANCER SCREENING: Now recommend starting at age 37. At this time colonoscopy is not covered for routine screening until 37. There are take home tests that can be done between 45-49.   COLONOSCOPY:  Colonoscopy to screen for colon cancer is recommended for all women at age 91.  We know, you hate the idea of the prep.  We agree, BUT, having colon cancer and not knowing it is worse!!  Colon cancer so often starts as a polyp that can be seen and removed at colonscopy, which can quite literally save your life!  And if your first colonoscopy is normal and you have no family history of colon cancer, most women don't have to have it again for  10 years.  Once every ten years, you can do something that may end up saving your life, right?  We will be happy to help you get it scheduled when you are ready.  Be sure to check your insurance coverage so you understand how much it will cost.  It may be covered as a preventative service at no cost, but you should check your particular policy.   ° ° ° °Breast Self-Awareness °Breast self-awareness means being familiar with how your breasts look and feel. It involves checking your breasts regularly and reporting any changes to your  health care provider. °Practicing breast self-awareness is important. A change in your breasts can be a sign of a serious medical problem. Being familiar with how your breasts look and feel allows you to find any problems early, when treatment is more likely to be successful. All women should practice breast self-awareness, including women who have had breast implants. °How to do a breast self-exam °One way to learn what is normal for your breasts and whether your breasts are changing is to do a breast self-exam. To do a breast self-exam: °Look for Changes ° °1. Remove all the clothing above your waist. °2. Stand in front of a mirror in a room with good lighting. °3. Put your hands on your hips. °4. Push your hands firmly downward. °5. Compare your breasts in the mirror. Look for differences between them (asymmetry), such as: °? Differences in shape. °? Differences in size. °? Puckers, dips, and bumps in one breast and not the other. °6. Look at each breast for changes in your skin, such as: °? Redness. °? Scaly areas. °7. Look for changes in your nipples, such as: °? Discharge. °? Bleeding. °? Dimpling. °? Redness. °? A change in position. °Feel for Changes °Carefully feel your breasts for lumps and changes. It is best to do this while lying on your back on the floor and again while sitting or standing in the shower or tub with soapy water on your skin. Feel each breast in the following way: °· Place the arm on the side of the breast you are examining above your head. °· Feel your breast with the other hand. °· Start in the nipple area and make ¾ inch (2 cm) overlapping circles to feel your breast. Use the pads of your three middle fingers to do this. Apply light pressure, then medium pressure, then firm pressure. The light pressure will allow you to feel the tissue closest to the skin. The medium pressure will allow you to feel the tissue that is a little deeper. The firm pressure will allow you to feel the tissue  close to the ribs. °· Continue the overlapping circles, moving downward over the breast until you feel your ribs below your breast. °· Move one finger-width toward the center of the body. Continue to use the ¾ inch (2 cm) overlapping circles to feel your breast as you move slowly up toward your collarbone. °· Continue the up and down exam using all three pressures until you reach your armpit. ° °Write Down What You Find ° °Write down what is normal for each breast and any changes that you find. Keep a written record with breast changes or normal findings for each breast. By writing this information down, you do not need to depend only on memory for size, tenderness, or location. Write down where you are in your menstrual cycle, if you are still menstruating. °If you are having trouble noticing differences   in your breasts, do not get discouraged. With time you will become more familiar with the variations in your breasts and more comfortable with the exam. How often should I examine my breasts? Examine your breasts every month. If you are breastfeeding, the best time to examine your breasts is after a feeding or after using a breast pump. If you menstruate, the best time to examine your breasts is 5-7 days after your period is over. During your period, your breasts are lumpier, and it may be more difficult to notice changes. When should I see my health care provider? See your health care provider if you notice:  A change in shape or size of your breasts or nipples.  A change in the skin of your breast or nipples, such as a reddened or scaly area.  Unusual discharge from your nipples.  A lump or thick area that was not there before.  Pain in your breasts.  Anything that concerns you.    Informacin sobre los anticonceptivos orales Oral Contraception Information Los anticonceptivos orales (ACO) son medicamentos que se utilizan para Location managerevitar el embarazo. Los ACO se toma por va oral y Network engineeractan de las  siguientes maneras:  Evitan que los ovarios liberen vulos.  Espesan la mucosidad en la parte inferior del tero (cuello uterino), lo cual impide que los espermatozoides ingresen en el tero.  Adelgazan el revestimiento del tero (endometrio), lo cual evita que un vulo fertilizado se adhiera al endometrio. Los ACO son muy efectivos cuando se toman exactamente como se indica. Sin embargo, no evitan las infeccin de transmisin sexual (ITS). La prctica del sexo seguro, como el uso de preservativos junto con un ACO, puede ayudar a prevenir las ITS. Antes de comenzar a tomar un ACO Antes de comenzar a tomar un ACO, debe hacerse un examen fsico, anlisis de sangre y Neomia Dearuna prueba de Papanicolaou. Sin embargo, no es necesario que se haga un examen plvico para que le receten un ACO. El mdico se asegurar de que usted sea [place] buena candidata para usar anticonceptivos orales. Los ACO no son Neomia Dearuna buena opcin para ciertas mujeres, incluidas las mujeres que fuman y que son Copymayores de 35aos de edad y las mujeres con antecedentes mdicos de hipertensin arterial, trombosis venosa profunda, embolia pulmonar, accidente cerebrovascular, enfermedad cardiovascular o enfermedad vascular perifrica. Converse con su mdico acerca de los posibles efectos secundarios de los ACO que podran recetarle. Cuando comienza el uso de un ACO, tenga en cuenta que su organismo puede tardar de 2 a 3 meses en adaptarse a los Amgen Inccambios en los niveles hormonales. Siga las indicaciones de su mdico acerca de cmo empezar a Building services engineertomar el primer ciclo del ACO. En funcin de cundo comienza a Chief of Stafftomar la pastilla, es posible que deba usar un mtodo anticonceptivo de respaldo, como preservativos, durante la primera semana. Asegrese de saber qu hacer si alguna vez se olvida de tomar la pldora. Tipos de Smith[place] Foodsanticonceptivos orales  Los tipos ms comunes de anticonceptivos orales contienen estrgeno y progestina (progesterona sinttica), o solo  progestina. La pldora combinada Este tipo de pldora contiene estrgeno y Theatre stage managerprogestina. Las pldoras combinadas a menudo vienen en paquetes de 21, 28 o 91 pldoras. En cada paquete, las ltimas 7pldoras pueden no contener hormonas, lo que significa que puede dejar de tomar las pldoras durante 7das. El sangrado menstrual ocurre durante la semana en que no toma las pldoras o que toma las pldoras que no contienen hormonas. La minipldora Este tipo de pldora contiene progesterona solamente. Viene en  paquetes de 28 pldoras. Las 28 pldoras contienen la hormona. [place]. Es importante que las tome a la misma hora todos [place]. Ventajas de los anticonceptivos orales  Proporcionan una anticoncepcin confiable y continua si se toman segn las indicaciones.  Pueden tratar o disminuir los sntomas de lo siguiente: ? Los clicos de los perodos [place]. ? Ciclos menstruales o sangrado irregulares. ? Flujo menstrual abundante. ? Sangrado uterino anormal. ? Acn, segn el tipo de pldora. ? Sndrome ovrico poliqustico. ? Endometriosis. ? Anemia por carencia de hierro. ? Sntomas premenstruales, incluido el trastorno disfrico premenstrual.  Pueden reducir el riesgo de cncer endometrial y de ovario.  Pueden usarse como anticonceptivo de Freight forwarder.  Evitan los embarazos extrauterinos (ectpicos) y las infecciones de las trompas de [place]. Factores que pueden hacer que los anticonceptivos orales sean menos efectivos Pueden ser menos efectivos si:  Se olvida de tomar la pldora todos los das a la misma hora. Esto es especialmente importante al tomar la minipldora.  Tiene una enfermedad estomacal o intestinal que reduce la capacidad del cuerpo para absorber la pldora.  Toma los ACO junto con otros medicamentos que los hacen menos efectivos, como antibiticos, ciertos medicamentos para el VIH y algunos medicamentos para las convulsiones.  Toma ACO  que han vencido.  Se olvida de Brewing technologist da 7 si [place] el envase de 21das. Riesgos asociados con el uso de los anticonceptivos orales Los anticonceptivos orales pueden, en [employer], causar [employer], como los siguientes:  Dolor de [place].  Depresin.  Dificultad para dormir.  Nuseas y vmitos.  Dolor a Radiographer, therapeutic.  Sangrado o manchado irregulares durante los primeros meses.  Meteorismo o retencin de lquidos.  Aumento de la presin arterial. Las pldoras combinadas tambin se asocian con un pequeo aumento en el riesgo de:  Cogulos de [place].  Infarto de miocardio.  Accidente cerebrovascular. Resumen  Los anticonceptivos orales son medicamentos que se toman por va oral para Environmental health practitioner. Son muy efectivos cuando se toman exactamente como se indica.  Los tipos ms comunes de anticonceptivos orales contienen estrgeno y progestina (progesterona sinttica), o solo progestina.  Antes de comenzar a tomar anticonceptivos orales, debe hacerse un examen fsico, anlisis de [place] y Ardelia Mems prueba de Papanicolaou. El mdico se asegurar de que usted sea [place] buena candidata para usar anticonceptivos orales.  La pldora combinada puede venir en paquetes de 21, 28 o 91das. La minipldora contiene la hormona progesterona solamente y viene en paquetes de 28pldoras.  Los anticonceptivos orales a Pension scheme manager causar [employer], como dolor de [place], nuseas, dolor a la palpacin en las mamas o sangrado irregular. Esta informacin no tiene Marine scientist el consejo del mdico. Asegrese de hacerle al mdico cualquier pregunta que tenga. Document Released: 01/12/2005 Document Revised: 01/09/2017 Document Reviewed: 01/09/2017 Elsevier Patient Education  2020 [employer].

## 2018-12-07 NOTE — Progress Notes (Signed)
38 y.o. [id number] Married Other or two or more races Hispanic or Latino female here as a new patient for an annual exam. Patient has had issues with irregular and heavy menses in the past. [facility] and was given Provera.   She is taking provera for 14 days a month. Typically she bleeds after the provera x 4 days. Some months she spots every day (3 out of the last 6 months). Hasn't tried anything else.  Period Cycle (Days): 28 Period Duration (Days): 4 Period Pattern: (!) Irregular(regular recently due to Provera) Menstrual Flow: Heavy Menstrual Control: Maxi pad Menstrual Control Change Freq (Hours): 2 Dysmenorrhea: (!) Severe Dysmenorrhea Symptoms: Cramping, Headache  Cramps are really bad for the whole cycle. She does miss work because of it, no help with ibuprofen. Occasionally goes through a pad in an hour.   In 8/17 she had an ultrasound with an enlarged uterus, no fibroids, thickened endometrium In 8/17 negative endometrial biopsy  Patient's last menstrual period was 11/30/2018.          Sexually active: Yes.    The current method of family planning [place]    Exercising: Yes.    walking Smoker:  no  Health Maintenance: Pap:  Done 2019 per [facility] at [facility] History of abnormal Pap:  no MMG:  [facility] 3 [facility] TDaP:  UTD per patient  Gardasil: never   reports that she has never smoked. She has never used smokeless tobacco. She reports that she does not drink alcohol or use drugs. She works in Quarry managerroofing. Kids are 17, 15, 13 and 11.   Past Medical History:  Diagnosis Date  . Arthritis     Past Surgical History:  Procedure Laterality Date  . STERILIZATION  2013   BTL    Current Outpatient Medications  Medication Sig Dispense Refill  . medroxyPROGESTERone (PROVERA) 10 MG tablet One tab po qday x 14 days each month. You can expect to have a period  within a few days of stopping the pill 42 tablet 3   No current facility-administered medications for this visit.     Family History  Problem Relation Age of Onset  . Asthma Mother   . Hyperlipidemia Mother   . Brain cancer Father   . Alzheimer's disease Paternal Grandmother     Review of Systems  Constitutional: Negative.   HENT: Negative.   Eyes: Negative.   Respiratory: Negative.   Cardiovascular: Negative.   Gastrointestinal: Negative.   Endocrine: Negative.   Genitourinary: Negative.   Musculoskeletal: Negative.   Skin: Negative.   Allergic/Immunologic: Negative.   Neurological: Negative.   Hematological: Negative.   Psychiatric/Behavioral: Negative.     Exam:   BP 112/72 (BP Location: Right Arm, Patient Position: Sitting, Cuff Size: Large)   Pulse 72   Temp (!) 97.4 F (36.3 C) (Temporal)   Resp 12   Ht 5' 1.5" (1.562 m)   Wt 196 lb 9.6 oz (89.2 kg)   LMP 11/30/2018   BMI 36.55 kg/m   Weight change: @WEIGHTCHANGE @ Height:   Height: 5' 1.5" (156.2 cm)  Ht Readings from Last 3 Encounters:  12/07/18 5' 1.5" (1.562 m)  07/13/17 5\' 2"  (1.575 m)  11/29/15 5\' 1"  (1.549 m)    General appearance: alert, cooperative and appears stated age Head: Normocephalic, without obvious abnormality, atraumatic Neck: no adenopathy, supple, symmetrical, trachea midline and thyroid  normal to inspection and palpation Lungs: clear to auscultation bilaterally Cardiovascular: regular rate and rhythm Breasts: normal appearance, no masses or tenderness Abdomen: soft, non-tender; non distended,  no masses,  no organomegaly Extremities: extremities normal, atraumatic, no cyanosis or edema Skin: Skin color, texture, turgor normal. No rashes or lesions Lymph nodes: Cervical, supraclavicular, and axillary nodes normal. No abnormal inguinal nodes palpated Neurologic: Grossly normal   Pelvic: External genitalia:  no lesions              Urethra:  normal appearing urethra with no masses,  tenderness or lesions              Bartholins and Skenes: normal                 Vagina: normal appearing vagina with normal color and discharge, no lesions              Cervix: no cervical motion tenderness and no lesions               Bimanual Exam:  Uterus:  anteverted, mobile, normal sized, minimally tender              Adnexa: no mass, fullness, tenderness               Rectovaginal: Confirms               Anus:  normal sphincter tone, no lesions  Chaperone was present for exam.  A:  Well Woman with normal exam  Menometrorrhagia, on cyclic provera. No contraindications to OCP's. Last ultrasound was 3 years ago, thickened stripe.  P:   Pap with hpv  Return for ultrasound  Discussed OCP's, no contraindications, risks reviewed. If her ultrasound is normal will do a trial of OCP's  Discussed breast self exam  Discussed calcium and vit D intake  Screening labs, TSH

## 2018-12-08 LAB — COMPREHENSIVE METABOLIC PANEL
ALT: 15 IU/L (ref 0–32)
AST: 21 IU/L (ref 0–40)
Albumin/Globulin Ratio: 1.7 (ref 1.2–2.2)
Albumin: 4.4 g/dL (ref 3.8–4.8)
Alkaline Phosphatase: 57 IU/L (ref 39–117)
BUN/Creatinine Ratio: 11 (ref 9–23)
BUN: 8 mg/dL (ref 6–20)
Bilirubin Total: 0.4 mg/dL (ref 0.0–1.2)
CO2: 25 mmol/L (ref 20–29)
Calcium: 9.1 mg/dL (ref 8.7–10.2)
Chloride: 101 mmol/L (ref 96–106)
Creatinine, Ser: 0.72 mg/dL (ref 0.57–1.00)
GFR calc Af Amer: 125 mL/min/{1.73_m2} (ref >59)
GFR calc non Af Amer: 108 mL/min/{1.73_m2} (ref >59)
Globulin, Total: 2.6 g/dL (ref 1.5–4.5)
Glucose: 89 mg/dL (ref 65–99)
Potassium: 3.9 mmol/L (ref 3.5–5.2)
Sodium: 140 mmol/L (ref 134–144)
Total Protein: 7 g/dL (ref 6.0–8.5)

## 2018-12-08 LAB — CBC
Hematocrit: 32.8 % — ABNORMAL LOW (ref 34.0–46.6)
Hemoglobin: 10.6 g/dL — ABNORMAL LOW (ref 11.1–15.9)
MCH: 26.2 pg — ABNORMAL LOW (ref 26.6–33.0)
MCHC: 32.3 g/dL (ref 31.5–35.7)
MCV: 81 fL (ref 79–97)
Platelets: 411 10*3/uL (ref 150–450)
RBC: 4.05 x10E6/uL (ref 3.77–5.28)
RDW: 14.4 % (ref 11.7–15.4)
WBC: 4.6 10*3/uL (ref 3.4–10.8)

## 2018-12-08 LAB — LIPID PANEL
Chol/HDL Ratio: 5 ratio — ABNORMAL HIGH (ref 0.0–4.4)
Cholesterol, Total: 215 mg/dL — ABNORMAL HIGH (ref 100–199)
HDL: 43 mg/dL (ref >39)
LDL Calculated: 153 mg/dL — ABNORMAL HIGH (ref 0–99)
Triglycerides: 94 mg/dL (ref 0–149)
VLDL Cholesterol Cal: 19 mg/dL (ref 5–40)

## 2018-12-08 LAB — TSH: TSH: 2.06 u[IU]/mL (ref 0.450–4.500)

## 2018-12-10 ENCOUNTER — Telehealth: Payer: Self-pay | Admitting: Obstetrics and Gynecology

## 2018-12-10 LAB — CYTOLOGY - PAP
Diagnosis: NEGATIVE
HPV: NOT DETECTED

## 2018-12-10 NOTE — Telephone Encounter (Signed)
Call placed to patient to scheduled recommended ultrasound. Unable to leave a message, as the voice mailbox was full

## 2018-12-12 NOTE — Telephone Encounter (Signed)
Call placed to Marissa [name] to review benefit and to schedule recommended ultrasound. Marissa [name] acknowledges understanding of information presented. Marissa [name] is ready to proceed with scheduling. Marissa [name] is scheduled 12/18/2018 with Dr Talbert Nan. Marissa [name] is aware of the appointment date, arrival time and cancellation policy. No further questions. Will close encounter

## 2018-12-18 ENCOUNTER — Ambulatory Visit (INDEPENDENT_AMBULATORY_CARE_PROVIDER_SITE_OTHER): Payer: Self-pay | Admitting: Obstetrics and Gynecology

## 2018-12-18 ENCOUNTER — Encounter: Payer: Self-pay | Admitting: Obstetrics and Gynecology

## 2018-12-18 ENCOUNTER — Other Ambulatory Visit: Payer: Self-pay

## 2018-12-18 ENCOUNTER — Ambulatory Visit (INDEPENDENT_AMBULATORY_CARE_PROVIDER_SITE_OTHER): Payer: Self-pay

## 2018-12-18 VITALS — BP 122/84 | HR 60 | Temp 98.8°F | Wt 198.6 lb

## 2018-12-18 DIAGNOSIS — N921 Excessive and frequent menstruation with irregular cycle: Secondary | ICD-10-CM

## 2018-12-18 DIAGNOSIS — N83201 Unspecified ovarian cyst, right side: Secondary | ICD-10-CM

## 2018-12-18 MED ORDER — NORETHIN ACE-ETH ESTRAD-FE 1-20 MG-MCG PO TABS: 1.0000 | ORAL_TABLET | Freq: Every day | ORAL | 0 refills | Status: DC

## 2018-12-18 NOTE — Patient Instructions (Signed)
Informacin sobre los anticonceptivos orales Oral Contraception Information Los anticonceptivos orales (ACO) son medicamentos que se utilizan para Therapist, occupational. Los ACO se toma por va oral y Designer, television/film set de las siguientes maneras:  Evitan que los ovarios liberen vulos.  Espesan la mucosidad en la parte inferior del tero (cuello uterino), lo cual impide que los espermatozoides ingresen en el tero.  Adelgazan el revestimiento del tero (endometrio), lo cual evita que un vulo fertilizado se adhiera al endometrio. Los ACO son muy efectivos cuando se toman exactamente como se indica. Sin embargo, no evitan las infeccin de transmisin sexual (ITS). La prctica del sexo seguro, como el uso de preservativos junto con un ACO, puede ayudar a prevenir las ITS. Antes de comenzar a tomar un ACO Antes de comenzar a tomar un ACO, debe hacerse un examen fsico, anlisis de sangre y Ardelia Mems prueba de Papanicolaou. Sin embargo, no es necesario que se haga un examen plvico para que le receten un ACO. El mdico se asegurar de que usted sea [place] buena candidata para usar anticonceptivos orales. Los ACO no son Ardelia Mems buena opcin para ciertas mujeres, incluidas las mujeres que fuman y que son Development worker, community de 71aos de edad y las mujeres con antecedentes mdicos de hipertensin arterial, trombosis venosa profunda, embolia pulmonar, accidente cerebrovascular, enfermedad cardiovascular o enfermedad vascular perifrica. Converse con su mdico acerca de los posibles efectos secundarios de los ACO que podran recetarle. Cuando comienza el uso de un ACO, tenga en cuenta que su organismo puede tardar de 2 a 3 meses en adaptarse a los [employer]. Siga las indicaciones de su mdico acerca de cmo empezar a Holiday representative del ACO. En funcin de cundo comienza a International aid/development worker, es posible que deba usar un mtodo anticonceptivo de respaldo, como preservativos, durante la primera semana. Asegrese de saber  qu hacer si alguna vez se olvida de tomar la pldora. Tipos de [employer] tipos ms comunes de anticonceptivos orales contienen estrgeno y progestina (progesterona sinttica), o solo progestina. La pldora combinada Este tipo de pldora contiene estrgeno y Agricultural engineer. Las pldoras combinadas a menudo vienen en paquetes de 21, 28 o 91 pldoras. En cada paquete, las ltimas 7pldoras pueden no contener hormonas, lo que significa que puede dejar de tomar las pldoras durante 7das. El sangrado menstrual ocurre durante la semana en que no toma las pldoras o que toma las pldoras que no contienen hormonas. La minipldora Este tipo de pldora contiene progesterona solamente. Viene en paquetes de 28 pldoras. Las 28 pldoras contienen la hormona. [place]. Es importante que las tome a la misma hora todos [place]. Ventajas de los anticonceptivos orales  Proporcionan una anticoncepcin confiable y continua si se toman segn las indicaciones.  Pueden tratar o disminuir los sntomas de lo siguiente: ? Los clicos de los perodos [place]. ? Ciclos menstruales o sangrado irregulares. ? Flujo menstrual abundante. ? Sangrado uterino anormal. ? Acn, segn el tipo de pldora. ? Sndrome ovrico poliqustico. ? Endometriosis. ? Anemia por carencia de hierro. ? Sntomas premenstruales, incluido el trastorno disfrico premenstrual.  Pueden reducir el riesgo de cncer endometrial y de ovario.  Pueden usarse como anticonceptivo de Freight forwarder.  Evitan los embarazos extrauterinos (ectpicos) y las infecciones de las trompas de [place]. Factores que pueden hacer que los anticonceptivos orales sean menos efectivos Pueden ser menos efectivos si:  Se olvida de tomar la pldora todos los das a la misma hora. Esto es especialmente  importante al tomar la minipldora.  Tiene una enfermedad estomacal o intestinal que reduce la capacidad del cuerpo para  absorber la pldora.  Toma los ACO junto con otros medicamentos que los hacen menos efectivos, como antibiticos, ciertos medicamentos para el VIH y algunos medicamentos para las convulsiones.  Toma ACO que han vencido.  Se olvida de Brewing technologist da 7 si [place] el envase de 21das. Riesgos asociados con el uso de los anticonceptivos orales Los anticonceptivos orales pueden, en [employer], causar [employer], como los siguientes:  Dolor de [place].  Depresin.  Dificultad para dormir.  Nuseas y vmitos.  Dolor a Radiographer, therapeutic.  Sangrado o manchado irregulares durante los primeros meses.  Meteorismo o retencin de lquidos.  Aumento de la presin arterial. Las pldoras combinadas tambin se asocian con un pequeo aumento en el riesgo de:  Cogulos de [place].  Infarto de miocardio.  Accidente cerebrovascular. Resumen  Los anticonceptivos orales son medicamentos que se toman por va oral para Environmental health practitioner. Son muy efectivos cuando se toman exactamente como se indica.  Los tipos ms comunes de anticonceptivos orales contienen estrgeno y progestina (progesterona sinttica), o solo progestina.  Antes de comenzar a tomar anticonceptivos orales, debe hacerse un examen fsico, anlisis de [place] y Ardelia Mems prueba de Papanicolaou. El mdico se asegurar de que usted sea [name] buena candidata para usar anticonceptivos orales.  La pldora combinada puede venir en paquetes de 21, 28 o 91das. La minipldora contiene la hormona progesterona solamente y viene en paquetes de 28pldoras.  Los anticonceptivos orales a Pension scheme manager causar [employer], como dolor de [place], nuseas, dolor a la palpacin en las mamas o sangrado irregular. Esta informacin no tiene Marine scientist el consejo del mdico. Asegrese de hacerle al mdico cualquier pregunta que tenga. Document Released: 01/12/2005 Document Revised: 01/09/2017 Document Reviewed:  01/09/2017 Elsevier Patient Education  2020 [employer].

## 2018-12-18 NOTE — Progress Notes (Signed)
GYNECOLOGY  VISIT   HPI: 38 y.o.   Married Other or two or more races Hispanic or Latino  female   [phone] with Patient's last menstrual period was 11/30/2018.   here for consult following PUS.  She has a h/o irregular and heavy cycles. Last year she was put on cyclic provera (for 2 weeks a month). With the provera she has a 4 day heavy cycle with severe cramps. She is having spotting ~1/2 of the time in between her cycles.   GYNECOLOGIC HISTORY: Patient's last menstrual period was 11/30/2018. [place]race[place] Menopausal hormone therapy: Provera       OB History    Gravida  5   Para  4   Term  4   Preterm      AB  1   Living  4     SAB  1   TAB      Ectopic      Multiple      Live Births           Obstetric Comments  All vaginal           Patient Active Problem List   Diagnosis Date Noted  . Abnormal uterine bleeding (AUB) 12/18/2015  . Constipation 12/02/2015  . Weight gain 09/26/2013  . Amenorrhea 09/26/2013  . Abdominal pain, left lower quadrant 09/26/2013    Past Medical History:  Diagnosis Date  . Arthritis     Past Surgical History:  Procedure Laterality Date  . STERILIZATION  2013   BTL    Current Outpatient Medications  Medication Sig Dispense Refill  . Ferrous Sulfate (IRON PO) Take by mouth.    . medroxyPROGESTERone (PROVERA) 10 MG tablet One tab po qday x 14 days each month. You can expect to have a period within a few days of stopping the pill 42 tablet 3   No current facility-administered medications for this visit.      ALLERGIES: Patient has no known allergies.  Family History  Problem Relation Age of Onset  . Asthma Mother   . Hyperlipidemia Mother   . Brain cancer Father   . Alzheimer's disease Paternal Grandmother     Social History   Socioeconomic History  . Marital status: Married    Spouse name: Not on file  . Number of children: Not on file  . Years of education: Not on file  .  Highest education level: Not on file  Occupational History  . Not on file  Social Needs  . Financial resource strain: Not on file  . Food insecurity    Worry: Not on file    Inability: Not on file  . Transportation needs    Medical: Not on file    Non-medical: Not on file  Tobacco Use  . Smoking status: Never Smoker  . Smokeless tobacco: Never Used  . Tobacco comment: smoked for 1 year over 3 years ago  Substance and Sexual Activity  . Alcohol use: No  . Drug use: No  . Sexual activity: Yes    Birth control/protection: Surgical  Lifestyle  . Physical activity    Days per week: Not on file    Minutes per session: Not on file  . Stress: Not on file  Relationships  . Social Musicianconnections    Talks on phone: Not on file    Gets together: Not on file    Attends religious service: Not on file    Active member of club or  organization: Not on file    Attends meetings of clubs or organizations: Not on file    Relationship status: Not on file  . Intimate partner violence    Fear of current or ex partner: Not on file    Emotionally abused: Not on file    Physically abused: Not on file    Forced sexual activity: Not on file  Other Topics Concern  . Not on file  Social History Narrative  . Not on file    Review of Systems  Constitutional: Negative.   HENT: Negative.   Eyes: Negative.   Respiratory: Negative.   Cardiovascular: Negative.   Gastrointestinal: Negative.   Genitourinary: Negative.   Musculoskeletal: Negative.   Skin: Negative.   Neurological: Negative.   Endo/Heme/Allergies: Negative.   Psychiatric/Behavioral: Negative.     PHYSICAL EXAMINATION:    BP 122/84 (BP Location: Right Arm, Patient Position: Sitting, Cuff Size: Normal)   Pulse 60   Temp 98.8 F (37.1 C) (Skin)   Wt 198 lb 9.6 oz (90.1 kg)   LMP 11/30/2018   BMI 36.92 kg/m     General appearance: alert, cooperative and appears stated age  Ultrasound images reviewed with the patient. She has a  right ovarian cyst within a cyst. No other abnormalities noted  ASSESSMENT Menometrorrhagia on cyclic provera Right ovarian cyst    PLAN F/U in 3 months for a repeat ultrasound and pill check (no contraindications, risks reviewed and information given) She will calendar her bleeding   An After Visit Summary was printed and given to the patient.    Addendum: the patient was seen with the assistance of an interpreter

## 2019-02-13 ENCOUNTER — Other Ambulatory Visit: Payer: Self-pay | Admitting: Gastroenterology

## 2019-02-13 DIAGNOSIS — R110 Nausea: Secondary | ICD-10-CM

## 2019-02-18 ENCOUNTER — Ambulatory Visit
Admission: RE | Admit: 2019-02-18 | Discharge: 2019-02-18 | Disposition: A | Discharge status: Home / Self Care | Payer: No Typology Code available for payment source | Source: Ambulatory Visit | Attending: Gastroenterology | Admitting: Gastroenterology

## 2019-02-18 DIAGNOSIS — R110 Nausea: Secondary | ICD-10-CM

## 2019-02-20 ENCOUNTER — Ambulatory Visit: Payer: Self-pay | Admitting: Orthopaedic Surgery

## 2019-02-27 ENCOUNTER — Ambulatory Visit: Payer: Self-pay | Admitting: Orthopaedic Surgery

## 2019-03-07 ENCOUNTER — Telehealth: Payer: Self-pay | Admitting: Obstetrics and Gynecology

## 2019-03-07 NOTE — Telephone Encounter (Signed)
Call placed to patient to schedule three month follow up ultrasound. Reviewed patient responsibility, patient acknowledges understanding of information presented. Patient is scheduled 03/19/2019 with Dr Talbert Nan. Patient is aware of the appointment date, arrival time and cancellation policy. No further questions.  Forwarding to Dr Talbert Nan for final review. Patient is agreeable to disposition. Will close encounter

## 2019-03-11 ENCOUNTER — Encounter: Payer: Self-pay | Admitting: Obstetrics and Gynecology

## 2019-03-12 ENCOUNTER — Ambulatory Visit (INDEPENDENT_AMBULATORY_CARE_PROVIDER_SITE_OTHER): Payer: Self-pay

## 2019-03-12 ENCOUNTER — Ambulatory Visit: Payer: Self-pay | Admitting: Orthopaedic Surgery

## 2019-03-12 ENCOUNTER — Encounter: Payer: Self-pay | Admitting: Orthopaedic Surgery

## 2019-03-12 ENCOUNTER — Other Ambulatory Visit: Payer: Self-pay

## 2019-03-12 ENCOUNTER — Ambulatory Visit: Payer: Self-pay

## 2019-03-12 DIAGNOSIS — M25562 Pain in left knee: Secondary | ICD-10-CM

## 2019-03-12 DIAGNOSIS — M25561 Pain in right knee: Secondary | ICD-10-CM

## 2019-03-12 DIAGNOSIS — G8929 Other chronic pain: Secondary | ICD-10-CM

## 2019-03-12 MED ORDER — MELOXICAM 7.5 MG PO TABS: ORAL_TABLET | ORAL | 2 refills | Status: DC

## 2019-03-12 NOTE — Progress Notes (Signed)
Office Visit Note   Patient: Marissa [name]           Date of Birth: 11/27/1980           MRN: [id number] Visit Date: 03/12/2019              Requested by: No referring provider defined for this encounter. PCP: Patient, No Pcp Per   Assessment & Plan: Visit Diagnoses:  1. Chronic pain of both knees     Plan: Impression is right knee pain likely from recent increase in activity.  I have recommended relative rest for the next several weeks as well as oral anti-inflammatories.  If she does not have significant improvement of symptoms she will follow-up with [place] for possible cortisone injections.  Call with concerns or questions in the meantime.  Follow-Up Instructions: Return if symptoms worsen or fail to improve.   Orders:  Orders Placed This Encounter  Procedures  . XR KNEE 3 VIEW RIGHT  . XR KNEE 3 VIEW LEFT   No orders of the defined types were placed in this encounter.     Procedures: No procedures performed   Clinical Data: No additional findings.   Subjective: Chief Complaint  Patient presents with  . Right Knee - Pain  . Left Knee - Pain    HPI patient is a pleasant 38 year old Spanish-speaking female who is here with an interpreter today.  She has had bilateral knee pain right greater than left for the past 2 months.  No specific injury, but she does note that she started exercising which included walking and Zumba classes.  The pain she has is to the entire aspect of both knees.  She has occasional swelling and popping.  No locking or catching.  No instability.  Her pain is aggravated during Zumba class as well as with any flexion of the knee.  She has not tried oral pain medications.  She has been using icy hot with mild relief of symptoms.  No numbness, tingling or burning.  No previous cortisone injection or surgical intervention to either knee.  Review of Systems as detailed in HPI.  All others reviewed and are negative.   Objective: Vital  Signs: There were no vitals taken for this visit.  Physical Exam well-developed and well-nourished female in no acute distress.  Alert and oriented x3.  Ortho Exam examination of both knees shows no effusion.  Range of motion 0 to 130 degrees.  Slight medial joint line tenderness left greater than right.  Moderate patellofemoral crepitus both sides.  Ligaments are stable.  She is neurovascular intact distally.  Specialty Comments:  No specialty comments available.  Imaging: Xr Knee 3 View Left  Result Date: 03/12/2019 No acute or structural abnormalities  Xr Knee 3 View Right  Result Date: 03/12/2019 No acute or structural abnormalities    PMFS History: Patient Active Problem List   Diagnosis Date Noted  . Abnormal uterine bleeding (AUB) 12/18/2015  . Constipation 12/02/2015  . Weight gain 09/26/2013  . Amenorrhea 09/26/2013  . Abdominal pain, left lower quadrant 09/26/2013   Past Medical History:  Diagnosis Date  . Arthritis     Family History  Problem Relation Age of Onset  . Asthma Mother   . Hyperlipidemia Mother   . Brain cancer Father   . Alzheimer's disease Paternal Grandmother     Past Surgical History:  Procedure Laterality Date  . STERILIZATION  2013   BTL   Social History   Occupational History  .  Not on file  Tobacco Use  . Smoking status: Never Smoker  . Smokeless tobacco: Never Used  . Tobacco comment: smoked for 1 year over 3 years ago  Substance and Sexual Activity  . Alcohol use: No  . Drug use: No  . Sexual activity: Yes    Birth control/protection: Surgical

## 2019-03-19 ENCOUNTER — Encounter: Payer: Self-pay | Admitting: Obstetrics and Gynecology

## 2019-03-19 ENCOUNTER — Other Ambulatory Visit: Payer: Self-pay

## 2019-03-19 ENCOUNTER — Telehealth: Payer: Self-pay | Admitting: Obstetrics and Gynecology

## 2019-03-19 ENCOUNTER — Other Ambulatory Visit: Payer: Self-pay | Admitting: Obstetrics and Gynecology

## 2019-03-19 NOTE — Progress Notes (Deleted)
GYNECOLOGY  VISIT   HPI: 38 y.o.   Married   Other or two or more races Unavailable Hispanic or Latino  female   [phone] with No LMP recorded.   here for     GYNECOLOGIC HISTORY: No LMP recorded. Contraception:*** Menopausal hormone therapy: ***        OB History    Gravida  6   Para  4   Term  4   Preterm  0   AB  1   Living        SAB  1   TAB  0   Ectopic  0   Multiple      Live Births           Obstetric Comments  All vaginal           Patient Active Problem List   Diagnosis Date Noted  . Abnormal uterine bleeding (AUB) 12/18/2015  . Constipation 12/02/2015  . Weight gain 09/26/2013  . Amenorrhea 09/26/2013  . Abdominal pain, left lower quadrant 09/26/2013    Past Medical History:  Diagnosis Date  . Arthritis     Past Surgical History:  Procedure Laterality Date  . STERILIZATION  2013   BTL    Current Outpatient Medications  Medication Sig Dispense Refill  . Ferrous Sulfate (IRON PO) Take by mouth.    . hydrocortisone (ANUSOL-HC) 25 MG suppository Place 1 suppository (25 mg total) rectally 2 (two) times daily. 12 suppository 1  . medroxyPROGESTERone (PROVERA) 10 MG tablet One tab po qday x 14 days each month. You can expect to have a period within a few days of stopping the pill 42 tablet 3  . MEGESTROL ACETATE PO Take by mouth daily.    . meloxicam (MOBIC) 7.5 MG tablet Take one tablet daily x 2 weeks and then prn 30 tablet 2  . norethindrone-ethinyl estradiol (LOESTRIN FE) 1-20 MG-MCG tablet Take 1 tablet by mouth daily. 3 Package 0   No current facility-administered medications for this visit.      ALLERGIES: Patient has no known allergies.  Family History  Problem Relation Age of Onset  . Asthma Mother   . Hyperlipidemia Mother   . Brain cancer Father   . Alzheimer's disease Paternal Grandmother     Social History   Socioeconomic History  . Marital status: Married    Spouse name: Not on file  . Number of children:  Not on file  . Years of education: Not on file  . Highest education level: Not on file  Occupational History  . Not on file  Social Needs  . Financial resource strain: Not on file  . Food insecurity    Worry: Not on file    Inability: Not on file  . Transportation needs    Medical: Not on file    Non-medical: Not on file  Tobacco Use  . Smoking status: Never Smoker  . Smokeless tobacco: Never Used  . Tobacco comment: smoked for 1 year over 3 years ago  Substance and Sexual Activity  . Alcohol use: No  . Drug use: No  . Sexual activity: Yes    Birth control/protection: Surgical  Lifestyle  . Physical activity    Days per week: Not on file    Minutes per session: Not on file  . Stress: Not on file  Relationships  . Social Musician on phone: Not on file    Gets together: Not on file  Attends religious service: Not on file    Active member of club or organization: Not on file    Attends meetings of clubs or organizations: Not on file    Relationship status: Not on file  . Intimate partner violence    Fear of current or ex partner: Not on file    Emotionally abused: Not on file    Physically abused: Not on file    Forced sexual activity: Not on file  Other Topics Concern  . Not on file  Social History Narrative   ** Merged History Encounter **        ROS  PHYSICAL EXAMINATION:    There were no vitals taken for this visit.    General appearance: alert, cooperative and appears stated age Neck: no adenopathy, supple, symmetrical, trachea midline and thyroid {CHL AMB PHY EX THYROID NORM DEFAULT:[phone]::"normal to inspection and palpation"} Breasts: {Exam; breast:13139::"normal appearance, no masses or tenderness"} Abdomen: soft, non-tender; non distended, no masses,  no organomegaly  Pelvic: External genitalia:  no lesions              Urethra:  normal appearing urethra with no masses, tenderness or lesions              Bartholins and Skenes: normal                  Vagina: normal appearing vagina with normal color and discharge, no lesions              Cervix: {CHL AMB PHY EX CERVIX NORM DEFAULT:[phone]::"no lesions"}              Bimanual Exam:  Uterus:  {CHL AMB PHY EX UTERUS NORM DEFAULT:[id number]::"normal size, contour, position, consistency, mobility, non-tender"}              Adnexa: {CHL AMB PHY EX ADNEXA NO MASS DEFAULT:[phone]::"no mass, fullness, tenderness"}              Rectovaginal: {yes no:[id number]}.  Confirms.              Anus:  normal sphincter tone, no lesions  Chaperone was present for exam.  ASSESSMENT     PLAN    An After Visit Summary was printed and given to the patient.  *** minutes face to face time of which over 50% was spent in counseling.

## 2019-03-19 NOTE — Telephone Encounter (Signed)
Patient [facility] today's ultrasound/consult appointment. Letter sent.

## 2019-04-16 ENCOUNTER — Telehealth: Payer: Self-pay | Admitting: Obstetrics and Gynecology

## 2019-04-16 ENCOUNTER — Other Ambulatory Visit: Payer: Self-pay | Admitting: Obstetrics and Gynecology

## 2019-04-16 DIAGNOSIS — N83201 Unspecified ovarian cyst, right side: Secondary | ICD-10-CM

## 2019-04-16 NOTE — Telephone Encounter (Signed)
Patient is ready to schedule her PUS appointment.  °

## 2019-04-16 NOTE — Telephone Encounter (Signed)
Medication refill request: JUNEL FE Last AEX:  12/07/18 JJ Next AEX: 12/11/19 Last MMG (if hormonal medication request): n/a Refill authorized: Please advise on refill

## 2019-04-16 NOTE — Telephone Encounter (Signed)
She is due for a pill check and f/u ultrasound. One pack sent. Please schedule.

## 2019-04-16 NOTE — Telephone Encounter (Signed)
Spoke with patient. PUS scheduled for 04/23/19 at 2pm, consult at 2:30pm with Dr. Talbert Nan. PUS is for f/u of right ovarian cyst. New order placed, previous order will expire. Patient verbalizes understanding and is agreeable.   Routing to provider for final review. Patient is agreeable to disposition. Will close encounter.  Cc: Magdalene Patricia, [address] [employer]

## 2019-04-17 ENCOUNTER — Telehealth: Payer: Self-pay | Admitting: Obstetrics and Gynecology

## 2019-04-17 NOTE — Telephone Encounter (Signed)
Spoke to Marissa [name]. Marissa [name] has [place] and pill check on 04/23/2019 scheduled. Advised Marissa [name] to keep appt on 04/23/2019 and will get more refills of Junel Rx then. Marissa [name] agreeable.   Will route to Dr Talbert Nan for review and will close encounter.

## 2019-04-17 NOTE — Telephone Encounter (Signed)
Call placed to patient to review benefit for scheduled ultrasound appointment on 04/23/2019. Patient advises she is unable to talk, but will call back.

## 2019-04-23 ENCOUNTER — Ambulatory Visit (INDEPENDENT_AMBULATORY_CARE_PROVIDER_SITE_OTHER): Payer: Self-pay | Admitting: Obstetrics and Gynecology

## 2019-04-23 ENCOUNTER — Other Ambulatory Visit: Payer: Self-pay

## 2019-04-23 ENCOUNTER — Encounter: Payer: Self-pay | Admitting: Obstetrics and Gynecology

## 2019-04-23 ENCOUNTER — Ambulatory Visit (INDEPENDENT_AMBULATORY_CARE_PROVIDER_SITE_OTHER): Payer: Self-pay

## 2019-04-23 VITALS — BP 122/62 | HR 81 | Temp 98.5°F | Ht 61.5 in | Wt 192.0 lb

## 2019-04-23 DIAGNOSIS — Z319 Encounter for procreative management, unspecified: Secondary | ICD-10-CM

## 2019-04-23 DIAGNOSIS — Z862 Personal history of diseases of the blood and blood-forming organs and certain disorders involving the immune mechanism: Secondary | ICD-10-CM

## 2019-04-23 DIAGNOSIS — N83201 Unspecified ovarian cyst, right side: Secondary | ICD-10-CM

## 2019-04-23 DIAGNOSIS — Z9851 Tubal ligation status: Secondary | ICD-10-CM

## 2019-04-23 DIAGNOSIS — N920 Excessive and frequent menstruation with regular cycle: Secondary | ICD-10-CM

## 2019-04-23 MED ORDER — NORETHIN ACE-ETH ESTRAD-FE 1-20 MG-MCG PO TABS: 1.0000 | ORAL_TABLET | Freq: Every day | ORAL | 3 refills | Status: DC

## 2019-04-23 NOTE — Progress Notes (Signed)
GYNECOLOGY  VISIT   HPI: 39 y.o.   Married   Other or two or more races Unavailable Hispanic or Latino  female   [phone] with No LMP recorded.   here for f/u of menometrorrhagia leading to anemia (hgb 10.6) and f/u of a right ovarian cyst noted on ultrasound in 9/20. No uterine abnormalities were noted at the time of the ultrasound and the patient was started on OCP's (Junel 1/20).    She is on her 4th pack of OCP's. Cycles are monthly x 4-6 days. Saturates a pad in up to 30 minutes, this can last for 1.5 days. No longer having BTB. She doesn't leave the house on those heavy days. No cramps.   GYNECOLOGIC HISTORY: LMP 03/19/19 Contraception:OCP's, Sterilization.  Menopausal hormone therapy: none        OB History    Gravida  6   Para  4   Term  4   Preterm  0   AB  1   Living        SAB  1   TAB  0   Ectopic  0   Multiple      Live Births           Obstetric Comments  All vaginal           Patient Active Problem List   Diagnosis Date Noted  . Abnormal uterine bleeding (AUB) 12/18/2015  . Constipation 12/02/2015  . Weight gain 09/26/2013  . Amenorrhea 09/26/2013  . Abdominal pain, left lower quadrant 09/26/2013    Past Medical History:  Diagnosis Date  . Arthritis     Past Surgical History:  Procedure Laterality Date  . STERILIZATION  2013   BTL    Current Outpatient Medications  Medication Sig Dispense Refill  . Ferrous Sulfate (IRON PO) Take by mouth.    . hydrocortisone (ANUSOL-HC) 25 MG suppository Place 1 suppository (25 mg total) rectally 2 (two) times daily. 12 suppository 1  . JUNEL FE 1/20 1-20 MG-MCG tablet Take 1 tablet by mouth once daily 28 tablet 0  . medroxyPROGESTERone (PROVERA) 10 MG tablet One tab po qday x 14 days each month. You can expect to have a period within a few days of stopping the pill 42 tablet 3  . MEGESTROL ACETATE PO Take by mouth daily.    . meloxicam (MOBIC) 7.5 MG tablet Take one tablet daily x 2 weeks and  then prn 30 tablet 2   No current facility-administered medications for this visit.     ALLERGIES: Patient has no known allergies.  Family History  Problem Relation Age of Onset  . Asthma Mother   . Hyperlipidemia Mother   . Brain cancer Father   . Alzheimer's disease Paternal Grandmother     Social History   Socioeconomic History  . Marital status: Married    Spouse name: Not on file  . Number of children: Not on file  . Years of education: Not on file  . Highest education level: Not on file  Occupational History  . Not on file  Tobacco Use  . Smoking status: Never Smoker  . Smokeless tobacco: Never Used  . Tobacco comment: smoked for 1 year over 3 years ago  Substance and Sexual Activity  . Alcohol use: No  . Drug use: No  . Sexual activity: Yes    Birth control/protection: Surgical  Other Topics Concern  . Not on file  Social History Narrative   ** Merged History Encounter **  Social Determinants of Health   Financial Resource Strain:   . Difficulty of Paying Living Expenses: Not on file  Food Insecurity:   . Worried About Programme researcher, broadcasting/film/video in the Last Year: Not on file  . Ran Out of Food in the Last Year: Not on file  Transportation Needs:   . Lack of Transportation (Medical): Not on file  . Lack of Transportation (Non-Medical): Not on file  Physical Activity:   . Days of Exercise per Week: Not on file  . Minutes of Exercise per Session: Not on file  Stress:   . Feeling of Stress : Not on file  Social Connections:   . Frequency of Communication with Friends and Family: Not on file  . Frequency of Social Gatherings with Friends and Family: Not on file  . Attends Religious Services: Not on file  . Active Member of Clubs or Organizations: Not on file  . Attends Banker Meetings: Not on file  . Marital Status: Not on file  Intimate Partner Violence:   . Fear of Current or Ex-Partner: Not on file  . Emotionally Abused: Not on file  .  Physically Abused: Not on file  . Sexually Abused: Not on file    ROS  PHYSICAL EXAMINATION:    There were no vitals taken for this visit.    General appearance: alert, cooperative and appears stated age  Ultrasound images reviewed, the right ovarian cyst is larger today measuring 4.6 x 4.1 x 4.3 cm, some debris, avascular. Stripe thickened at 14 mm, symmetrical, no flow. Otherwise normal ultrasound.   ASSESSMENT H/O menometrorrhagia leading to anemia. She took iron for a couple of months. No longer taking it. She continues to bleed very heavily for 1.5 days, no longer having BTB Right ovarian cyst, larger than in 9/20, some mild debris Contraception surveillance, help in cycle regularity not flow.     PLAN CBC, Ferritin We discussed the options of depo-provera (declines), the mirena IUD, endometrial ablation and hysterectomy to treat her menorrhagia.  She would like to have another baby so surgery is out of the question. She has seen a fertility specialist (in [place]), spoke with them about tubal reversal. She is setting up an appointment. Her tubal was in [place] years ago, she doesn't have the records.  If she has surgery, she should have ovarian cystectomy as well, told her to notify us and we can send the records to the fertility clinic Otherwise f/u ultrasound in 6 months Continue on OCP's

## 2019-04-24 LAB — CBC
Hematocrit: 39 % (ref 34.0–46.6)
Hemoglobin: 12 g/dL (ref 11.1–15.9)
MCH: 24.8 pg — ABNORMAL LOW (ref 26.6–33.0)
MCHC: 30.8 g/dL — ABNORMAL LOW (ref 31.5–35.7)
MCV: 81 fL (ref 79–97)
Platelets: 379 10*3/uL (ref 150–450)
RBC: 4.84 x10E6/uL (ref 3.77–5.28)
RDW: 18.7 % — ABNORMAL HIGH (ref 11.7–15.4)
WBC: 5.8 10*3/uL (ref 3.4–10.8)

## 2019-04-24 LAB — FERRITIN: Ferritin: 11 ng/mL — ABNORMAL LOW (ref 15–150)

## 2019-06-17 ENCOUNTER — Other Ambulatory Visit: Payer: Self-pay | Admitting: *Deleted

## 2019-06-17 ENCOUNTER — Other Ambulatory Visit: Payer: Self-pay

## 2019-06-17 ENCOUNTER — Other Ambulatory Visit (INDEPENDENT_AMBULATORY_CARE_PROVIDER_SITE_OTHER): Payer: Self-pay

## 2019-06-17 DIAGNOSIS — E7800 Pure hypercholesterolemia, unspecified: Secondary | ICD-10-CM

## 2019-06-18 LAB — LIPID PANEL
Chol/HDL Ratio: 4.2 ratio (ref 0.0–4.4)
Cholesterol, Total: 200 mg/dL — ABNORMAL HIGH (ref 100–199)
HDL: 48 mg/dL (ref >39)
LDL Chol Calc (NIH): 107 mg/dL — ABNORMAL HIGH (ref 0–99)
Triglycerides: 260 mg/dL — ABNORMAL HIGH (ref 0–149)
VLDL Cholesterol Cal: 45 mg/dL — ABNORMAL HIGH (ref 5–40)

## 2019-07-22 ENCOUNTER — Ambulatory Visit: Payer: Self-pay | Attending: Internal Medicine

## 2019-07-22 DIAGNOSIS — Z23 Encounter for immunization: Secondary | ICD-10-CM

## 2019-07-22 NOTE — Progress Notes (Signed)
   Covid-19 Vaccination Clinic  Name:  Jawanna Dykman    MRN: [id number] DOB: 03/03/1981  07/22/2019  Ms. Rosello was observed post Covid-19 immunization for 15 minutes without incident. She was provided with Vaccine Information Sheet and instruction to access the V-Safe system.   Ms. Sokol was instructed to call 911 with any severe reactions post vaccine: Marland Kitchen Difficulty breathing  . Swelling of face and throat  . A fast heartbeat  . A bad rash all over body  . Dizziness and weakness   Immunizations Administered    Name Date Dose VIS Date Route   Pfizer COVID-19 Vaccine 07/22/2019  3:38 PM 0.3 mL 03/29/2019 Intramuscular   Manufacturer: [employer], [employer]   Lot: [id number]   NDC: [address]-9

## 2019-08-14 ENCOUNTER — Ambulatory Visit: Payer: Self-pay

## 2019-08-15 ENCOUNTER — Ambulatory Visit: Payer: Self-pay

## 2019-08-30 ENCOUNTER — Telehealth: Payer: Self-pay | Admitting: Obstetrics and Gynecology

## 2019-08-30 NOTE — Telephone Encounter (Signed)
Patient has pain near belly button.

## 2019-08-30 NOTE — Telephone Encounter (Signed)
AEX 11/2018 OV 04/2019 with PUS  H/O right ovarian cyst Followed by fertility clinic in [place].  H/O BTL OCPs- Junel FE  Spoke with pt. Pt reports having abd "swelling" and burning sensation and "feels opening" under skin at umbilicus x 1 week. Pt states noticed having discomfort when getting up out of bed or when lying on sides. Pt states "swelling" comes and goes. Pt states also having light vaginal bleeding that started same day as abd swelling. Pt states changing only light pad every 4-5 hours, but notices light bleeding more when wiping. Pt denies skipping or missing any OCPs. States to start cycle on 09/03/19 per current pill pack.  Pt denies vaginal discharge, odor, severe abd pain, fever, chills. Pt does have some intermittent nausea as well. Reviewed pts chart, pt was to have tubal reversal, pt declined at this time. Pt states no longer seeing fertility clinic in [place]. Pt states had abd BTL, not laproscopic in [place]. Denies any injury or surgery or previous hernia to umbilical site.   Pt advised to be seen for further evaluation. Pt agreeable. Pt declines earlier appts offered today. Requesting Monday or Tuesday next week to see Dr Oscar La. Pt scheduled for OV on 09/03/19 at 8am. Pt agreeable and verbalized understanding of date and time of appt. CPS neg.  ER precautions advised over weekend. Pt agreeable.   Routing to Dr Oscar La for review.  Encounter closed.

## 2019-09-02 ENCOUNTER — Other Ambulatory Visit: Payer: Self-pay | Admitting: Gastroenterology

## 2019-09-02 DIAGNOSIS — R110 Nausea: Secondary | ICD-10-CM

## 2019-09-03 ENCOUNTER — Encounter: Payer: Self-pay | Admitting: Obstetrics and Gynecology

## 2019-09-03 ENCOUNTER — Telehealth: Payer: Self-pay

## 2019-09-03 ENCOUNTER — Ambulatory Visit: Payer: Self-pay | Admitting: Obstetrics and Gynecology

## 2019-09-03 NOTE — Telephone Encounter (Signed)
Patient did not keep appointment. Sent patient the letter.

## 2019-09-04 ENCOUNTER — Ambulatory Visit
Admission: RE | Admit: 2019-09-04 | Discharge: 2019-09-04 | Disposition: A | Discharge status: Home / Self Care | Payer: No Typology Code available for payment source | Source: Ambulatory Visit | Attending: Gastroenterology | Admitting: Gastroenterology

## 2019-09-04 DIAGNOSIS — R110 Nausea: Secondary | ICD-10-CM

## 2019-09-05 ENCOUNTER — Other Ambulatory Visit: Payer: Self-pay | Admitting: Gastroenterology

## 2019-09-05 DIAGNOSIS — R935 Abnormal findings on diagnostic imaging of other abdominal regions, including retroperitoneum: Secondary | ICD-10-CM

## 2019-09-17 ENCOUNTER — Telehealth: Payer: Self-pay | Admitting: Obstetrics and Gynecology

## 2019-09-17 ENCOUNTER — Other Ambulatory Visit: Payer: Self-pay

## 2019-09-17 DIAGNOSIS — N83201 Unspecified ovarian cyst, right side: Secondary | ICD-10-CM

## 2019-09-17 NOTE — Progress Notes (Signed)
Orders deleted by mistake and new orders placed for PUS.  Encounter closed.  Cc: Hayley for precert.

## 2019-09-17 NOTE — Telephone Encounter (Signed)
Call to patient. Per DPR, OK to leave message on voicemail.   Left voicemail [employer] , [place], [place] #[id number]) requesting a return call to [facility] to review benefits and schedule recommended Pelvic ultrasound with Gertie Exon, MD

## 2019-09-25 NOTE — Telephone Encounter (Signed)
Spoke with patient Marissa [name], [place], [place]#[id number]) regarding benefits for recommended ultrasound. Patient is aware that ultrasound is transvaginal. Patient acknowledges understanding of information presented. Patient is aware of cancellation policy. Patient scheduled appointment for 10/03/2019 at 0330PM with Gertie Exon, MD. Encounter closed.

## 2019-10-02 ENCOUNTER — Telehealth: Payer: Self-pay | Admitting: Obstetrics and Gynecology

## 2019-10-02 ENCOUNTER — Encounter: Payer: Self-pay | Admitting: Obstetrics and Gynecology

## 2019-10-02 NOTE — Telephone Encounter (Signed)
Patient says she would like to be seen Thursday for ultrasound and not reschedule.

## 2019-10-02 NOTE — Telephone Encounter (Signed)
Spoke with pt. Pt placed back on schedule for PUS and OV with Dr Oscar La for 10/03/19 at 3:30 pm. Pt agreeable and verbalized understanding.   Routing to Dr Oscar La for review.  Encounter closed.

## 2019-10-02 NOTE — Telephone Encounter (Signed)
Patient canceled her appointment for PUS tomorrow and would like to reschedule. I did remind her of the 48 hour cancellation policy.

## 2019-10-03 ENCOUNTER — Encounter: Payer: Self-pay | Admitting: Obstetrics and Gynecology

## 2019-10-03 ENCOUNTER — Other Ambulatory Visit: Payer: Self-pay

## 2019-10-03 ENCOUNTER — Other Ambulatory Visit: Payer: Self-pay | Admitting: Obstetrics and Gynecology

## 2019-10-03 ENCOUNTER — Telehealth: Payer: Self-pay

## 2019-10-03 NOTE — Telephone Encounter (Signed)
Patient did not show for [place] appointment today (10/03/19).

## 2019-10-04 NOTE — Telephone Encounter (Signed)
Left message for pt to return call to triage RN. 

## 2019-10-09 ENCOUNTER — Inpatient Hospital Stay: Admission: RE | Admit: 2019-10-09 | Payer: Self-pay | Source: Ambulatory Visit

## 2019-12-11 ENCOUNTER — Ambulatory Visit: Payer: Self-pay | Admitting: Obstetrics and Gynecology

## 2020-02-08 IMAGING — US US ABDOMEN COMPLETE
1 series · 14 of 25 positions shown · non-contrast
Comparison: None.

CLINICAL DATA: Nausea for 2 weeks

EXAM:
ABDOMEN ULTRASOUND COMPLETE

[Series 1: us abdomen complete · 0.23mm/px · 14 of 85 slices shown]
[im 1/85]
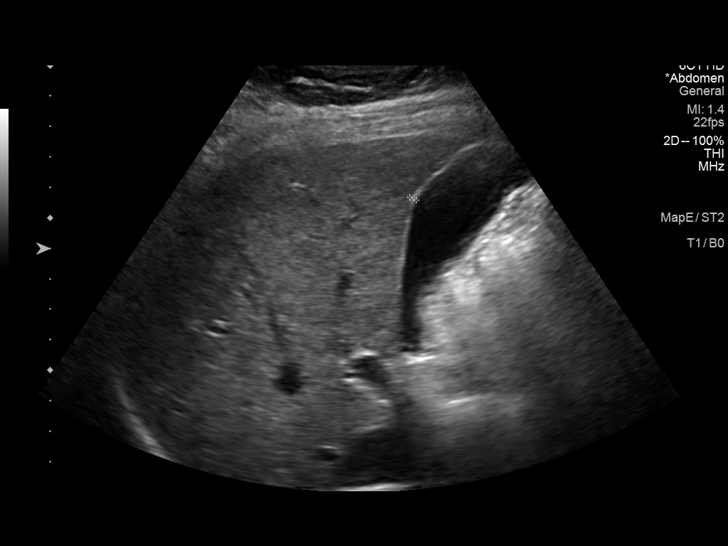
[im 8/85]
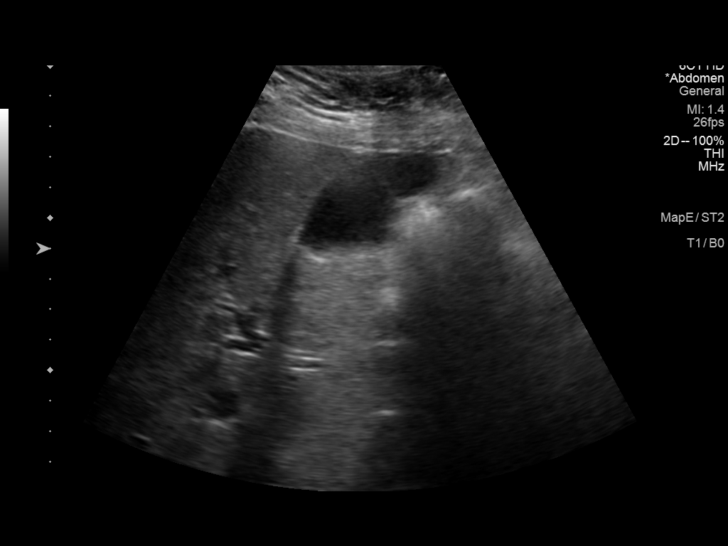
[im 15/85]
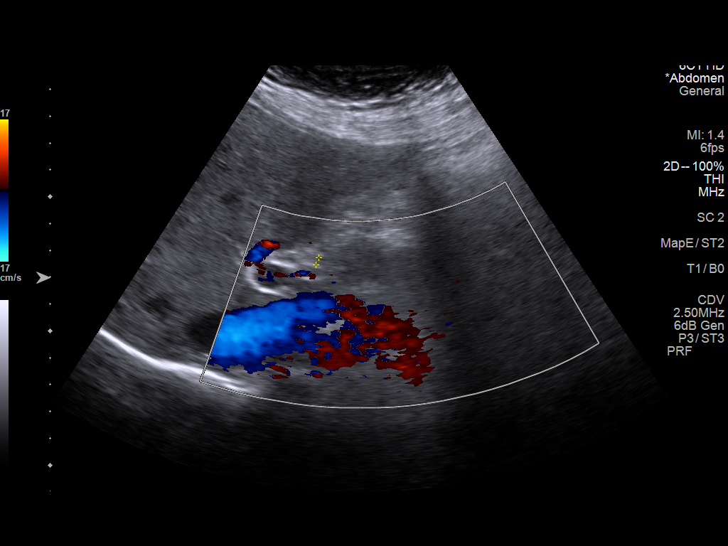
[im 22/85]
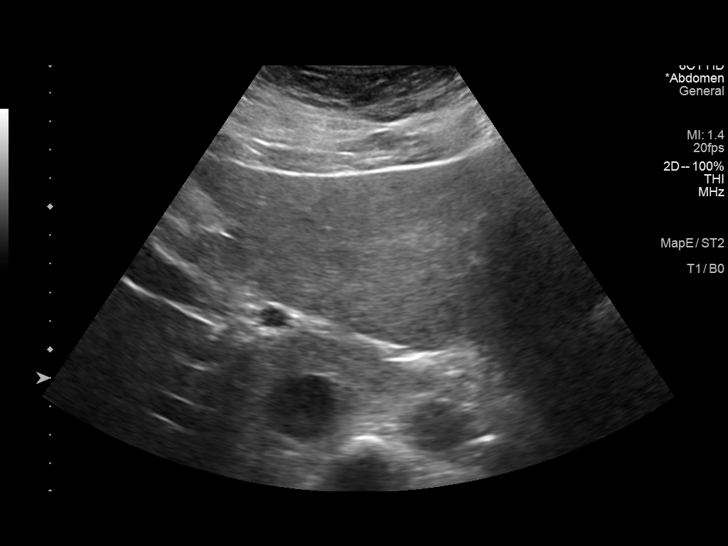
[im 29/85]
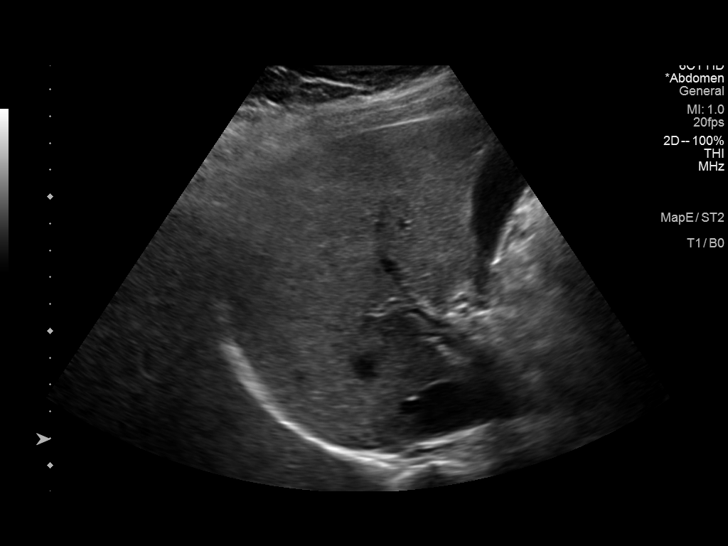
[im 32/85]
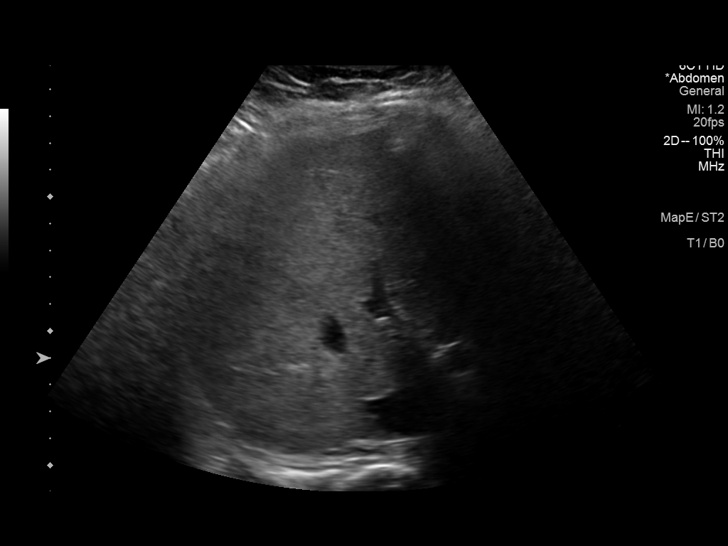
[im 39/85]
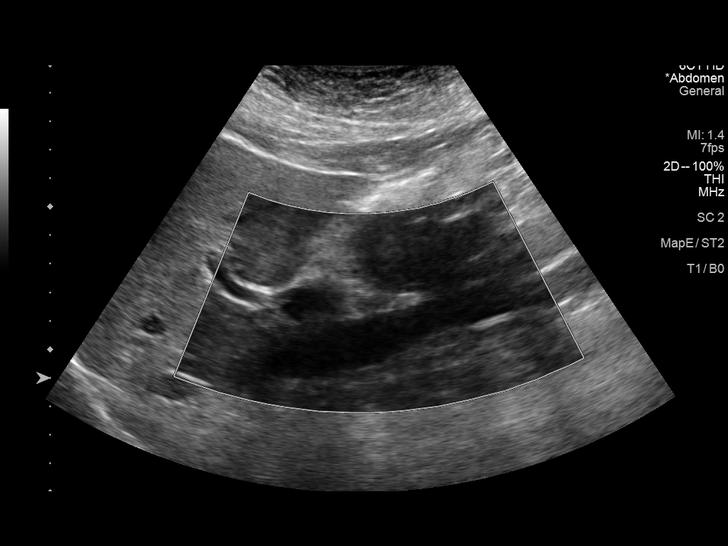
[im 46/85]
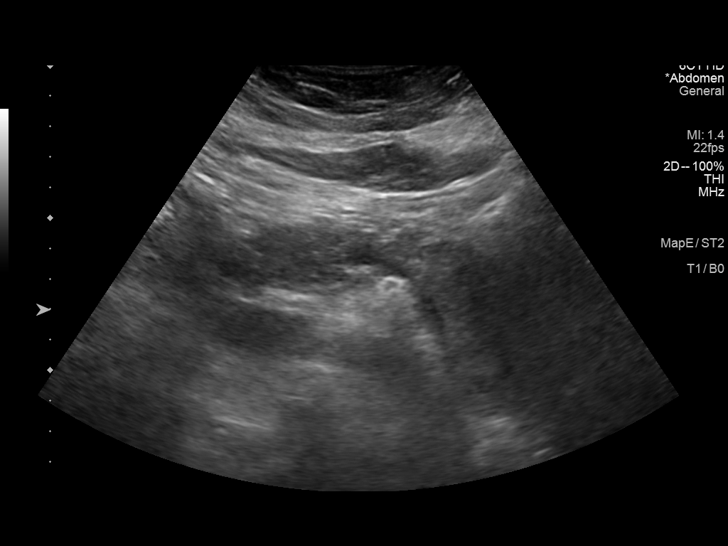
[im 53/85]
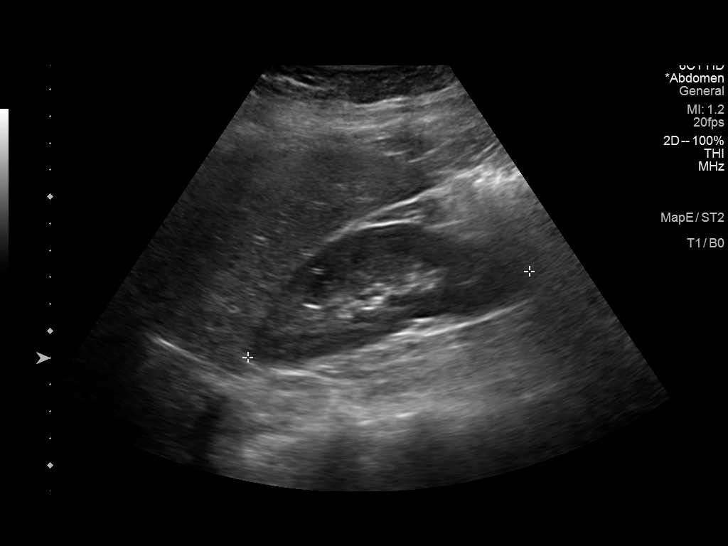
[im 57/85]
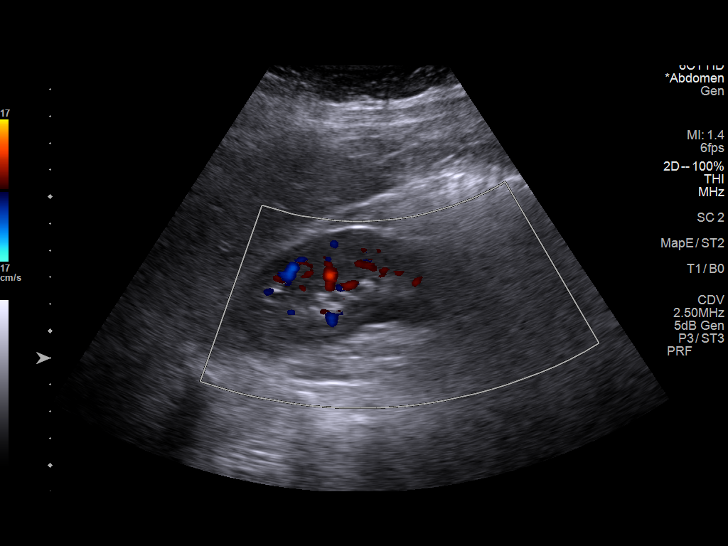
[im 64/85]
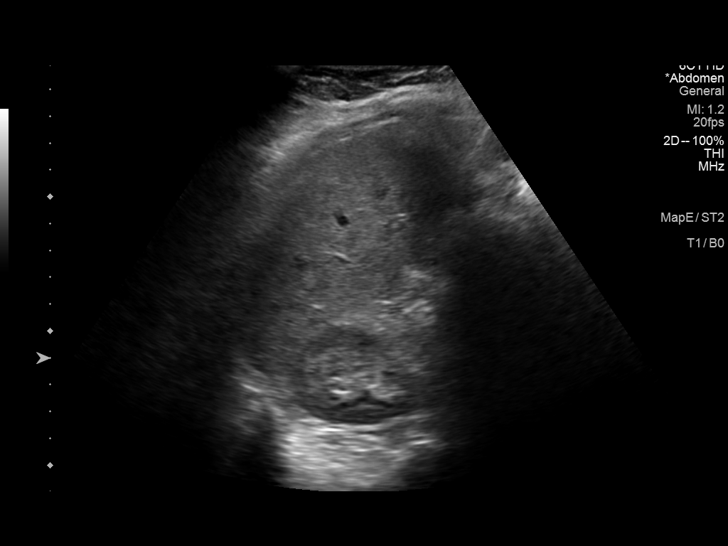
[im 71/85]
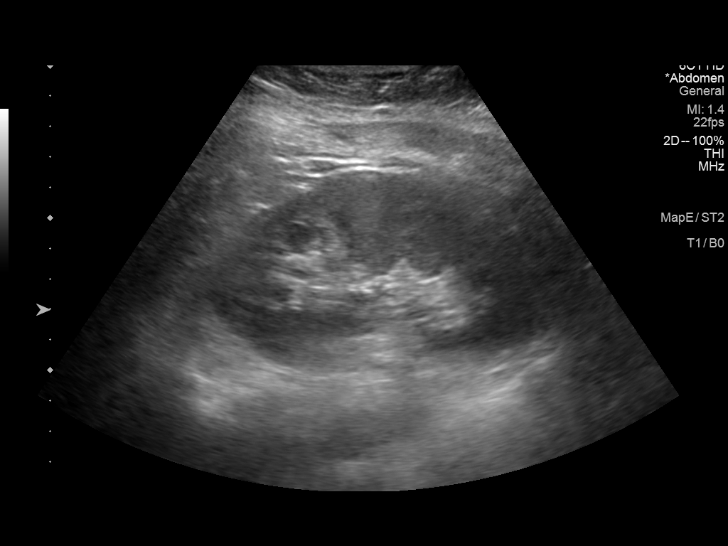
[im 78/85]
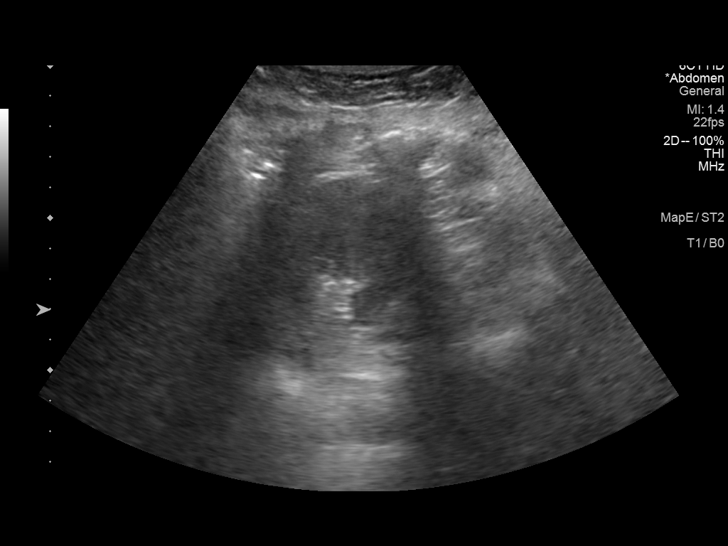
[im 85/85]
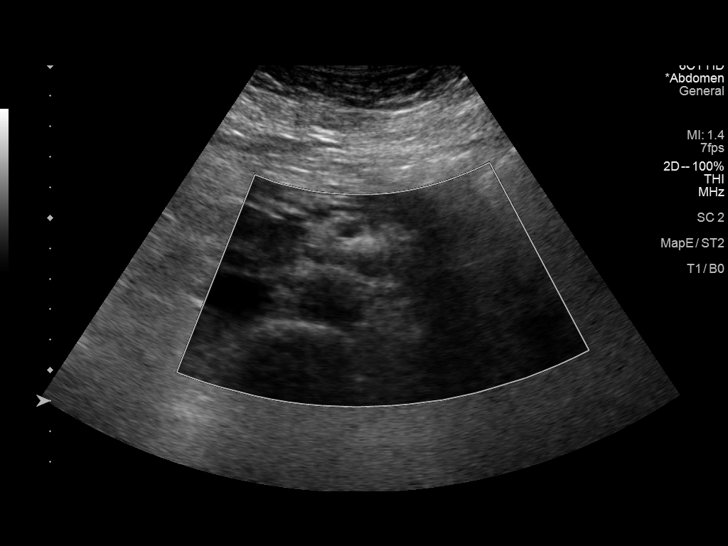

[14 of 25 positions shown; findings below may reference images not displayed]

FINDINGS: Gallbladder: No gallstones or wall thickening visualized. There is
no pericholecystic fluid. No sonographic Murphy sign noted by
sonographer.

Common bile duct: Diameter: 3 mm. No intrahepatic, common hepatic,
or common bile duct dilatation.

Liver: No focal lesion identified. Within normal limits in
parenchymal echogenicity. Portal vein is patent on color Doppler
imaging with normal direction of blood flow towards the liver.

IVC: No abnormality visualized.

Pancreas: No pancreatic mass or inflammatory focus.

Spleen: Size and appearance within normal limits.

Right Kidney: Length: 11.0 cm. Echogenicity within normal limits. No
mass or hydronephrosis visualized.

Left Kidney: Length: 11.4 cm. Echogenicity within normal limits. No
mass or hydronephrosis visualized.

Abdominal aorta: No aneurysm visualized.

Other findings: No demonstrable ascites.
IMPRESSION: Study within normal limits.

## 2020-06-09 NOTE — Progress Notes (Deleted)
40 y.o. G66P4010 Married White or Caucasian Hispanic or Latino female here for annual exam.      No LMP recorded.          Sexually active: {yes no:314532}  The current method of family planning is {contraception:315051}.    Exercising: {yes no:314532}  {types:19826} Smoker:  {YES NO:22349}  Health Maintenance: Pap:  12/07/18 WNL HR HPV Neg  History of abnormal Pap:  no MMG:  None  BMD:   None  Colonoscopy: none  TDaP:  07/22/19  Gardasil: never   reports that she has never smoked. She has never used smokeless tobacco. She reports that she does not drink alcohol and does not use drugs.  Past Medical History:  Diagnosis Date  . Arthritis     Past Surgical History:  Procedure Laterality Date  . STERILIZATION  2013   BTL    Current Outpatient Medications  Medication Sig Dispense Refill  . norethindrone-ethinyl estradiol (JUNEL FE 1/20) 1-20 MG-MCG tablet Take 1 tablet by mouth daily. 84 tablet 3   No current facility-administered medications for this visit.    Family History  Problem Relation Age of Onset  . Asthma Mother   . Hyperlipidemia Mother   . Brain cancer Father   . Alzheimer's disease Paternal Grandmother     Review of Systems  Exam:   There were no vitals taken for this visit.  Weight change: @WEIGHTCHANGE @ Height:      Ht Readings from Last 3 Encounters:  04/23/19 5' 1.5" (1.562 m)  12/07/18 5' 1.5" (1.562 m)  07/13/17 5\' 2"  (1.575 m)    General appearance: alert, cooperative and appears stated age Head: Normocephalic, without obvious abnormality, atraumatic Neck: no adenopathy, supple, symmetrical, trachea midline and thyroid {CHL AMB PHY EX THYROID NORM DEFAULT:[phone]::"normal to inspection and palpation"} Lungs: clear to auscultation bilaterally Cardiovascular: regular rate and rhythm Breasts: {Exam; breast:13139::"normal appearance, no masses or tenderness"} Abdomen: soft, non-tender; non distended,  no masses,  no organomegaly Extremities:  extremities normal, atraumatic, no cyanosis or edema Skin: Skin color, texture, turgor normal. No rashes or lesions Lymph nodes: Cervical, supraclavicular, and axillary nodes normal. No abnormal inguinal nodes palpated Neurologic: Grossly normal   Pelvic: External genitalia:  no lesions              Urethra:  normal appearing urethra with no masses, tenderness or lesions              Bartholins and Skenes: normal                 Vagina: normal appearing vagina with normal color and discharge, no lesions              Cervix: {CHL AMB PHY EX CERVIX NORM DEFAULT:[phone]::"no lesions"}               Bimanual Exam:  Uterus:  {CHL AMB PHY EX UTERUS NORM DEFAULT:[phone]::"normal size, contour, position, consistency, mobility, non-tender"}              Adnexa: {CHL AMB PHY EX ADNEXA NO MASS [phone]::"no mass, fullness, tenderness"}               Rectovaginal: Confirms               Anus:  normal sphincter tone, no lesions  *** chaperoned for the exam.  A:  Well Woman with normal exam  P:

## 2020-06-10 ENCOUNTER — Ambulatory Visit: Payer: Self-pay | Admitting: Obstetrics and Gynecology

## 2020-08-24 IMAGING — US US ABDOMEN COMPLETE
1 series · 13 of 25 positions shown · non-contrast
Comparison: None.

CLINICAL DATA: Nausea and abdominal pain

EXAM:
ABDOMEN ULTRASOUND COMPLETE

[Series 1: us abdomen complete · 0.26mm/px · 13 of 122 slices shown]
[im 1/122]
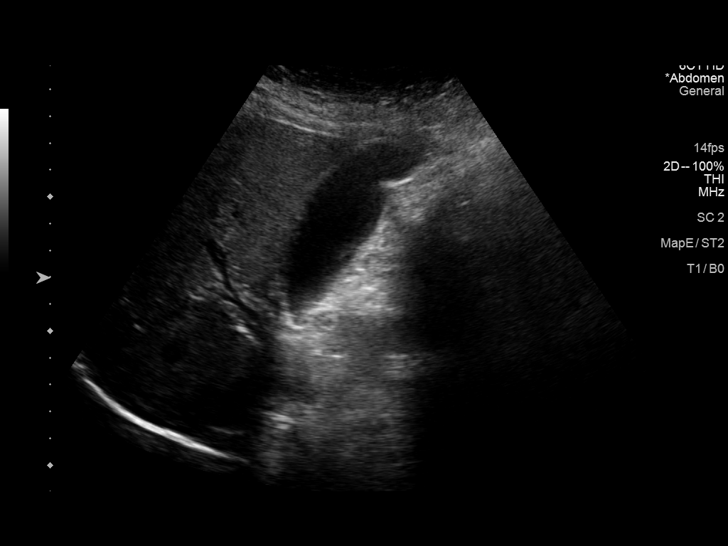
[im 11/122]
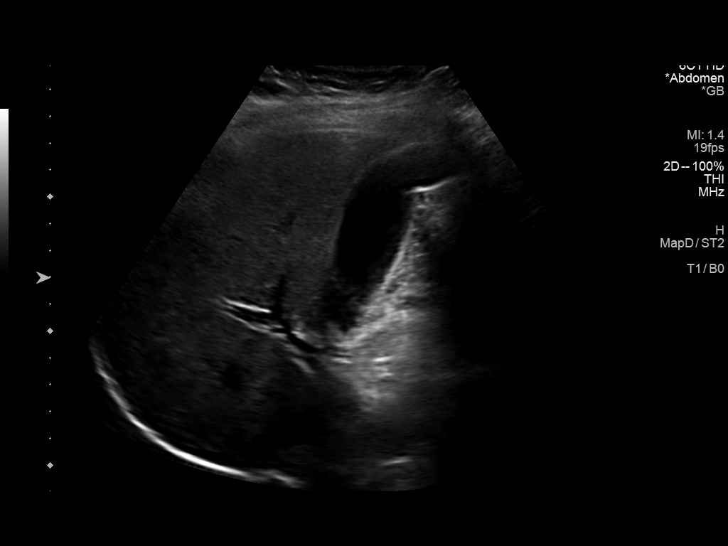
[im 21/122]
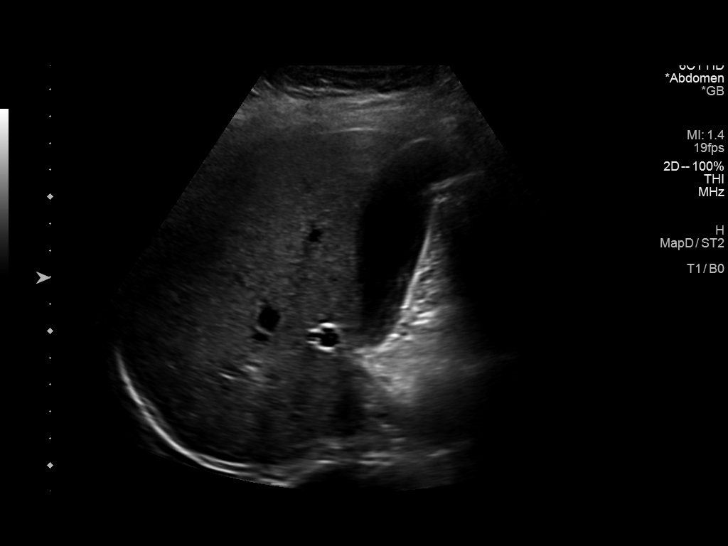
[im 31/122]
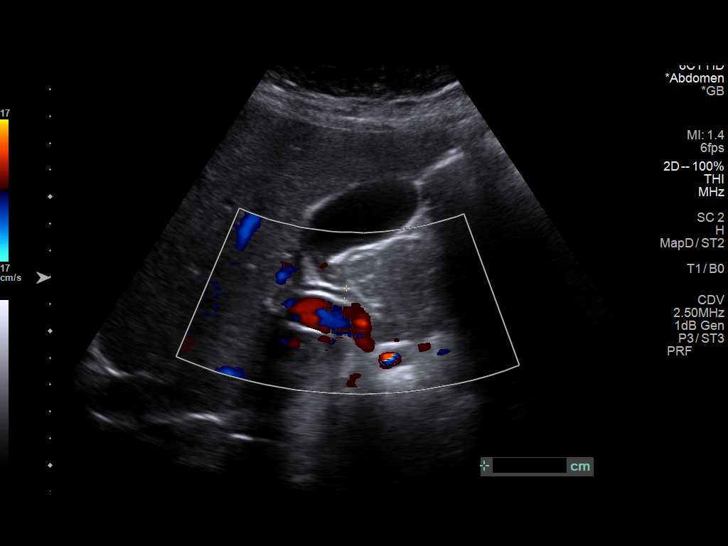
[im 41/122]
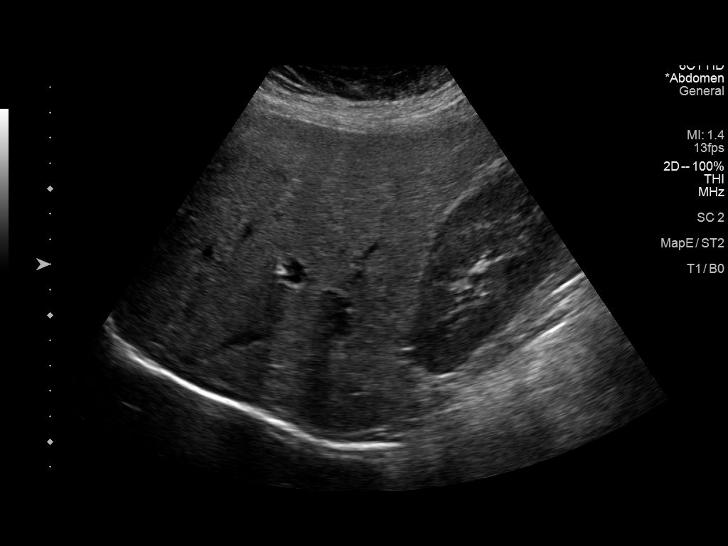
[im 51/122]
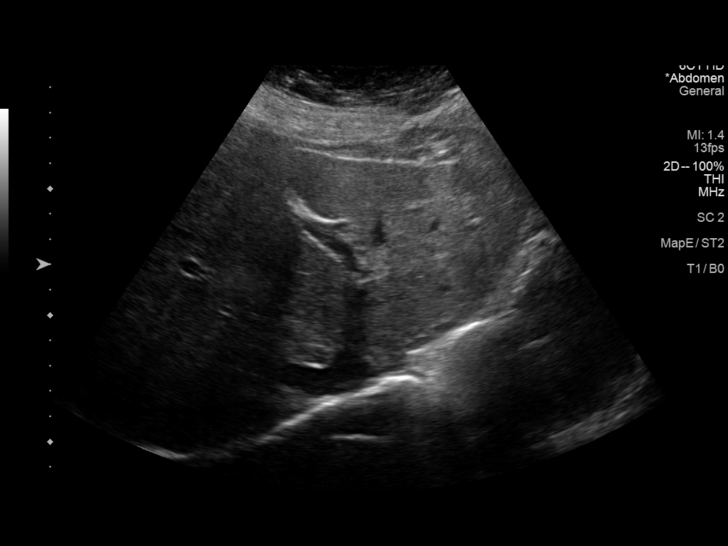
[im 61/122]
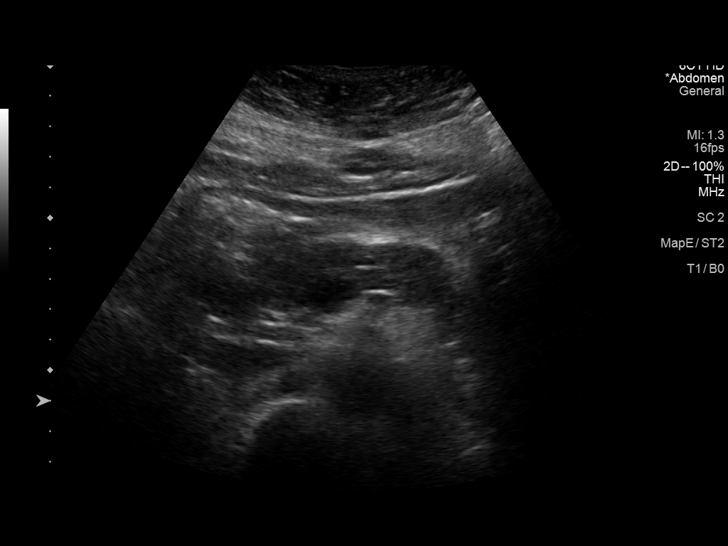
[im 71/122]
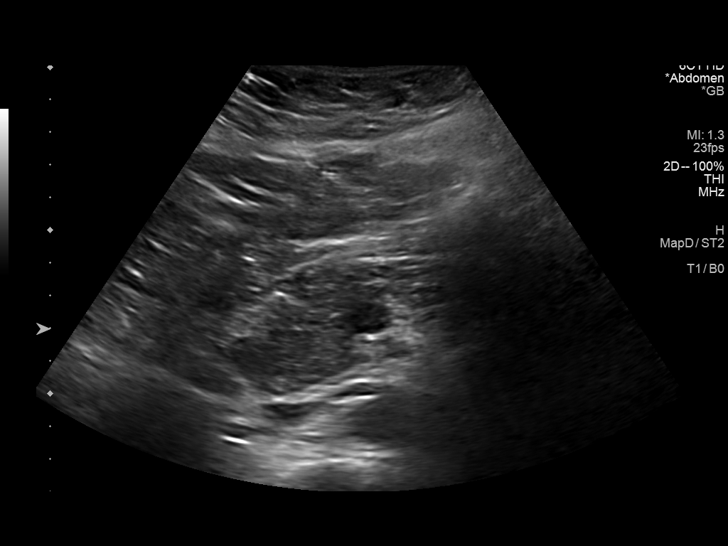
[im 81/122]
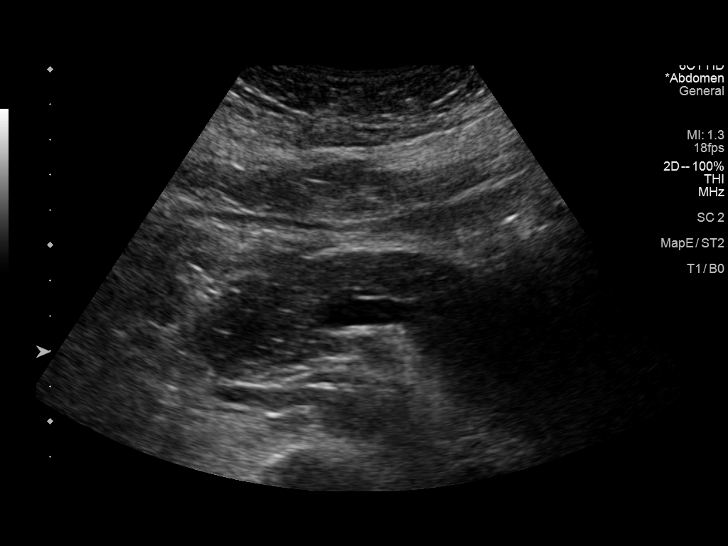
[im 91/122]
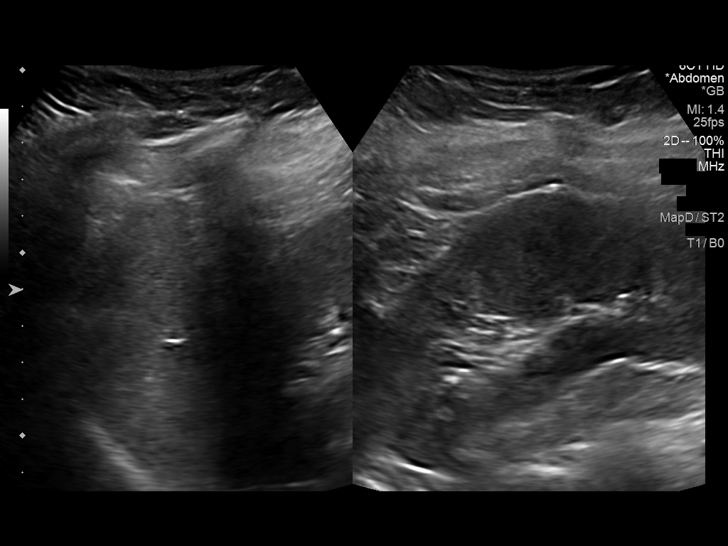
[im 101/122]
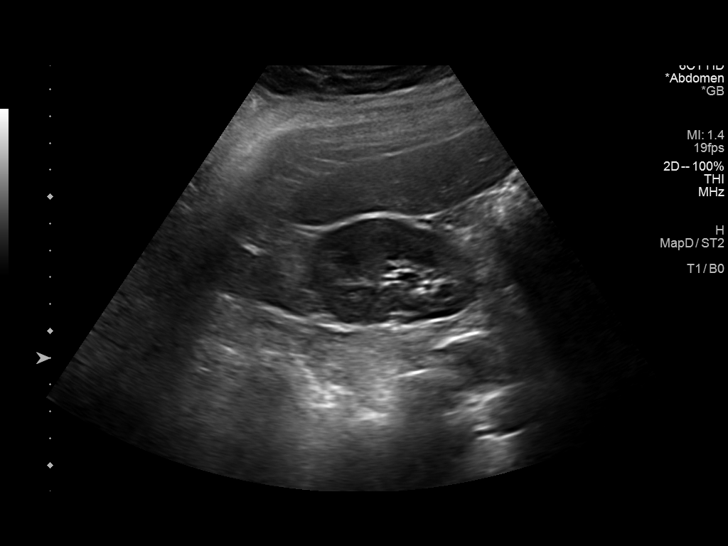
[im 111/122]
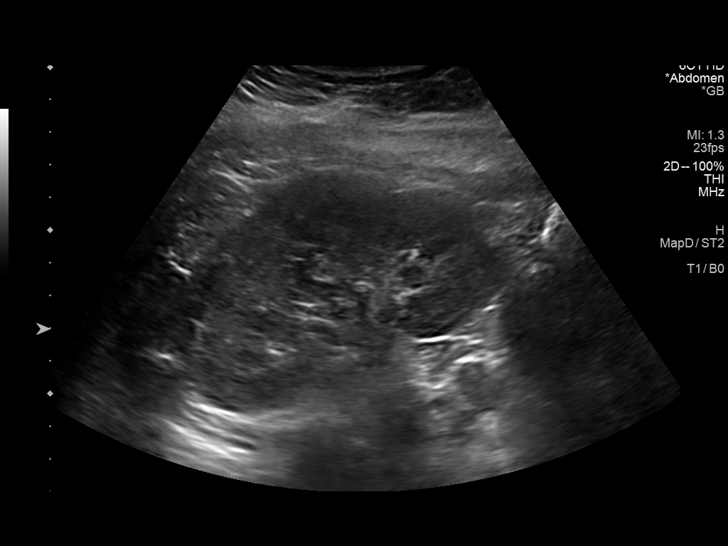
[im 122/122]
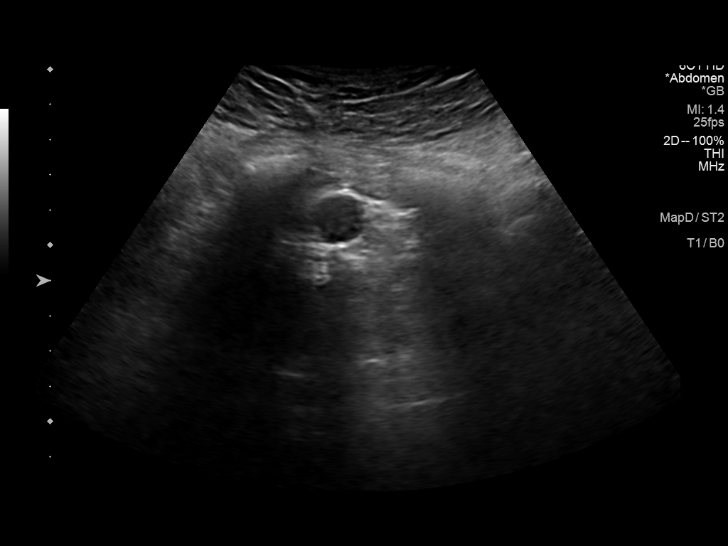

[13 of 25 positions shown; findings below may reference images not displayed]

FINDINGS: Gallbladder: There is a 6 mm presumed polyp near the neck of the
gallbladder. A 3 mm presumed polyp is noted elsewhere along the
anterior wall of the gallbladder. Neither of these foci move or
shadow. There are no echogenic foci which move and shadow as is
expected with cholelithiasis. No gallbladder wall thickening or
pericholecystic fluid. No sonographic Murphy sign noted by
sonographer.

Common bile duct: Diameter: 5 mm. No intrahepatic, common hepatic,
or common bile duct dilatation.

Liver: No focal lesion identified. Within normal limits in
parenchymal echogenicity. Portal vein is patent on color Doppler
imaging with normal direction of blood flow towards the liver.

IVC: No abnormality visualized.

Pancreas: The pancreatic head region appears slightly hypoechoic and
subtly edematous. Remainder of the pancreas appears normal. No
pancreatic duct dilatation or well-defined pancreatic mass.

Spleen: Size and appearance within normal limits.

Right Kidney: Length: 11.1 cm. Echogenicity within normal limits. No
mass or hydronephrosis visualized.

Left Kidney: Length: 11.9 cm. Echogenicity within normal limits. No
mass or hydronephrosis visualized.

Abdominal aorta: No aneurysm visualized.

Other findings: No demonstrable ascites.
IMPRESSION: 1. Subtle decreased echogenicity in the head of the pancreas with
equivocal edema in this area. Question early acute pancreatitis.
Appropriate laboratory correlation in this regard advised. If
further imaging of this area is felt to be warranted, MR or CT of
the pancreas pre and post-contrast could be helpful for further
evaluate.

2. Apparent polyps in the gallbladder, largest measuring 6 mm. Per
consensus guidelines, polyps in this size range do not warrant
additional imaging surveillance. No evident gallstones, gallbladder
wall thickening, or pericholecystic fluid.

3.  Study otherwise unremarkable.

These results will be called to the ordering clinician or
representative by the Radiologist Assistant, and communication
documented in the PACS or [REDACTED].

## 2020-11-16 NOTE — Progress Notes (Signed)
40 y.o. G49P4010 Married White or Caucasian Hispanic or Latino female here for annual exam.  Patient states that she has not missed any pills. She has only had the one period in June of this year.  She is having right upper quadrant pressure that makes her uncomfortable. She is also having left lower quadrant discomfort when lifting. She had a CT in 6/22 that showed a 3.5 cm right ovarian cyst. F/U ultrasound recommended in 2 months.  She had an ultrasound here in 1/21 that showed a 4.5 cm right ovarian cyst that was avascular with minimal debris. She didn't come for the f/u ultrasound that was recommended.   She was on OCP's, went off of them for year and restarted them in June. She only took them for one month (only had one pack). Off of OCP's cycles were coming monthly through January. She had her next cycle in June and started the pill.  Normally her cycles last for 4 days, she saturates a pad in up to 2 hours.  In June she bleed for 10 days, she was changing her pad in up to 30 minutes. She would like to go back on OCP's.  No hirsutism.  Period Duration (Days): 10 days Period Pattern: (!) Irregular Menstrual Flow: Heavy Menstrual Control: Maxi pad Menstrual Control Change Freq (Hours): 30 min Dysmenorrhea: None  Patient's last menstrual period was 09/22/2020.          Sexually active: Yes.    The current method of family planning is OCP (estrogen/progesterone).    Exercising: Yes.     Cardio and weights  Smoker:  no  Health Maintenance: Pap:  12/07/18 WNL Hr HPV Neg  History of abnormal Pap:  no MMG:  n/a BMD:   n/a Colonoscopy: n/a TDaP:  utd per patient  Gardasil: never    reports that she has never smoked. She has never used smokeless tobacco. She reports that she does not drink alcohol and does not use drugs. Rare ETOH. Working in heat and air. Kids are 19, 17, 15, 13.   Past Medical History:  Diagnosis Date   Arthritis     Past Surgical History:  Procedure Laterality  Date   STERILIZATION  2013   BTL    Current Outpatient Medications  Medication Sig Dispense Refill   norethindrone-ethinyl estradiol (JUNEL FE 1/20) 1-20 MG-MCG tablet Take 1 tablet by mouth daily. 84 tablet 3   No current facility-administered medications for this visit.    Family History  Problem Relation Age of Onset   Asthma Mother    Hyperlipidemia Mother    Brain cancer Father    Alzheimer's disease Paternal Grandmother     Review of Systems  All other systems reviewed and are negative.  Exam:   BP 120/60   Pulse 79   Ht 5\' 2"  (1.575 m)   Wt 207 lb (93.9 kg)   LMP 09/22/2020   SpO2 98%   BMI 37.86 kg/m   Weight change: @WEIGHTCHANGE @ Height:   Height: 5\' 2"  (157.5 cm)  Ht Readings from Last 3 Encounters:  11/17/20 5\' 2"  (1.575 m)  04/23/19 5' 1.5" (1.562 m)  12/07/18 5' 1.5" (1.562 m)    General appearance: alert, cooperative and appears stated age Head: Normocephalic, without obvious abnormality, atraumatic Neck: no adenopathy, supple, symmetrical, trachea midline and thyroid normal to inspection and palpation Lungs: clear to auscultation bilaterally Cardiovascular: regular rate and rhythm Breasts: normal appearance, no masses or tenderness Abdomen: soft, non-tender; non distended,  no masses,  no organomegaly Extremities: extremities normal, atraumatic, no cyanosis or edema Skin: Skin color, texture, turgor normal. No rashes or lesions Lymph nodes: Cervical, supraclavicular, and axillary nodes normal. No abnormal inguinal nodes palpated Neurologic: Grossly normal   Pelvic: External genitalia:  no lesions              Urethra:  normal appearing urethra with no masses, tenderness or lesions              Bartholins and Skenes: normal                 Vagina: normal appearing vagina with normal color and discharge, no lesions              Cervix: no lesions               Bimanual Exam:  Uterus:  normal size, contour, position, consistency, mobility,  non-tender              Adnexa: no mass, fullness, tenderness               Rectovaginal: declined  Carolynn Serve chaperoned for the exam.  1. Well woman exam Discussed breast self exam Discussed calcium and vit D intake No pap this year  2. Oligomenorrhea, unspecified type - TSH - Prolactin - medroxyPROGESTERone (PROVERA) 5 MG tablet; Take 1 tablet (5 mg total) by mouth daily.  Dispense: 5 tablet; Refill: 0 - norethindrone-ethinyl estradiol-FE (JUNEL FE 1/20) 1-20 MG-MCG tablet; Take 1 tablet by mouth daily. Start on the first day of your cycle after finishing the provera  Dispense: 84 tablet; Refill: 3  3. Right ovarian cyst Will return - US PELVIS TRANSVAGINAL NON-OB (TV ONLY); Future  4. Laboratory exam ordered as part of routine general medical examination - CBC - Comprehensive metabolic panel - Lipid panel  5. Elevated lipids - Lipid panel

## 2020-11-17 ENCOUNTER — Ambulatory Visit (INDEPENDENT_AMBULATORY_CARE_PROVIDER_SITE_OTHER): Payer: Self-pay | Admitting: Obstetrics and Gynecology

## 2020-11-17 ENCOUNTER — Other Ambulatory Visit: Payer: Self-pay

## 2020-11-17 ENCOUNTER — Encounter: Payer: Self-pay | Admitting: Obstetrics and Gynecology

## 2020-11-17 VITALS — BP 120/60 | HR 79 | Ht 62.0 in | Wt 207.0 lb

## 2020-11-17 DIAGNOSIS — N83201 Unspecified ovarian cyst, right side: Secondary | ICD-10-CM

## 2020-11-17 DIAGNOSIS — N915 Oligomenorrhea, unspecified: Secondary | ICD-10-CM

## 2020-11-17 DIAGNOSIS — Z01419 Encounter for gynecological examination (general) (routine) without abnormal findings: Secondary | ICD-10-CM

## 2020-11-17 DIAGNOSIS — Z0000 Encounter for general adult medical examination without abnormal findings: Secondary | ICD-10-CM

## 2020-11-17 DIAGNOSIS — E785 Hyperlipidemia, unspecified: Secondary | ICD-10-CM

## 2020-11-17 MED ORDER — NORETHIN ACE-ETH ESTRAD-FE 1-20 MG-MCG PO TABS: 1.0000 | ORAL_TABLET | Freq: Every day | ORAL | 3 refills | Status: DC

## 2020-11-17 MED ORDER — MEDROXYPROGESTERONE ACETATE 5 MG PO TABS: 5.0000 mg | ORAL_TABLET | Freq: Every day | ORAL | 0 refills | Status: DC

## 2020-11-17 NOTE — Patient Instructions (Signed)
Informacin sobre los anticonceptivos orales Oral Contraception Information Los anticonceptivos orales (ACO) son medicamentos que se toman por va oral para evitar un embarazo. Funcionan de las siguientes maneras: Evitando que los ovarios liberen vulos. Engrosando la mucosidad de la parte inferior del tero (cuello uterino). Esto evita que los espermatozoides entren en el tero. Adelgazando el revestimiento del tero (endometrio). Esto evita que el vulo fecundado se implante en el endometrio. Los ACO son muy efectivos cuando se toman exactamente como se prescriben. Sin embargo, no evitan las infecciones de transmisin sexual (ITS). El uso de condones junto con los ACO ayuda a prevenir ese tipo de enfermedades. Qu sucede antes de comenzar a tomar ACO? Antes de comenzar a tomar ACO: Quizs le harn un examen fsico, anlisis de sangre y prueba de Papanicolaou. El mdico se asegurar de que usted sea una buena candidata para usar anticonceptivos orales. Los ACO no son una buena opcin para ciertas mujeres, como: Mujeres que fuman y tienen ms de 35 aos. Mujeres que tienen o han tenido algunas afecciones, por ejemplo: Antecedentes de presin arterial alta. Trombosis venosa profunda. Embolia pulmonar. Accidente cerebrovascular. Enfermedad cardiovascular. Enfermedad vascular perifrica. Consulte al mdico acerca de los posibles efectos secundarios de los ACO que podra recetarle. Tenga en cuenta que puede llevar de 2 a 3 meses que el organismo se adapte a los cambios en los niveles hormonales. Tipos de anticonceptivos orales Las pldoras anticonceptivas contienen las hormonas estrgeno y progestina (progesterona sinttica) o progestina sola. La pldora combinada Este tipo de pldora contiene estrgeno y progestina. Las pldoras anticonceptivas convencionales vienen en envases de 21 o 28 pldoras. Algunos envases con pldoras para 28 das contienen estrgeno y progestina durante los primeros  21 a 24 das. Los comprimidos sin hormonas, llamados placebos, se toman durante los ltimos 4 a 7 das. Debera tener el sangrado menstrual durante el tiempo en que toma los placebos. Con los envases de 21 comprimidos, no se toman pldoras durante 7 das. El sangrado menstrual se produce durante estos das. (Algunas personas prefieren tomar una pldora durante 28 das para ayudar a establecer una rutina). Las pldoras anticonceptivas de intervalo extendido vienen en envases de 91 pldoras. Los primeros 84 comprimidos tienen estrgeno y progestina. Las ltimas 7 pldoras son placebos. El sangrado menstrual se produce durante los das en que se toma placebo. Con este programa, el sangrado menstrual se produce una vez cada 3 meses. Las pldoras anticonceptivas continuas vienen en envases de 28 pldoras. Todas las pldoras del envase contienen estrgeno y progestina. Con este programa, no se produce el sangrado menstrual regular, pero puede haber manchas o sangrado irregular. Pastillas de progestina sola Este tipo de pldora se denomina a menudo "minipldora" y contiene solo la hormona progestina. Viene en paquetes de 28 pldoras. En algunos envases, las ltimas 4 pldoras son placebos. La pldora debe tomarse a la misma hora todos los das. Esto es muy importante para evitar el embarazo. Es posible que el sangrado menstrual no sea regular o predecible. Cules son las ventajas? Los anticonceptivos orales proporcionan un mtodo anticonceptivo confiable y continuo si se toman segn las indicaciones. Pueden tratar o disminuir los sntomas de lo siguiente: Clicos durante los perodos menstruales. Ciclos menstruales o sangrado irregulares. Flujo menstrual abundante. Sangrado uterino anormal. Acn, segn el tipo de pldora. Sndrome del ovario poliqustico (SOP). Endometriosis. Anemia por deficiencia de hierro. Sntomas premenstruales, incluidos irritabilidad, depresin o ansiedad intensas. Tambin  pueden: Reducir el riesgo de cncer de endometrio y de ovario. Usarse como anticonceptivo de emergencia.   Evitar los embarazos ectpicos y las infecciones de las trompas de Falopio. Qu puede hacer que los ACO sean menos eficaces? Los ACO pueden ser menos eficaces si: Se olvida de tomar la pldora todos los das. En el caso de las pldoras de progestina sola, es especialmente importante tomar la pldora a la misma hora cada da. Incluso tomarla 3 horas tarde puede aumentar el riesgo de embarazo. Tiene una enfermedad estomacal o intestinal que reduce la capacidad del cuerpo para absorber la pldora. Toma los ACO junto con otros medicamentos que los hacen menos efectivos, como antibiticos, ciertos medicamentos para el VIH y algunos medicamentos para las convulsiones. Toma ACO que han vencido. Se olvida de reiniciar la toma de la pldora despus de 7 das de no tomarla. Esto se refiere a los envases de 21 pldoras. Cules son los efectos secundarios y los riesgos? Los ACO a veces pueden causar efectos secundarios, como: Dolor de cabeza. Depresin. Dificultad para dormir. Nuseas y vmitos. Dolor a la palpacin en las mamas. Sangrado o manchado irregulares durante los primeros meses. Meteorismo o retencin de lquidos. Aumento de la presin arterial. Las pldoras combinadas pueden aumentar ligeramente el riesgo de: Cogulos de sangre. Infarto de miocardio. Accidente cerebrovascular. Siga estas instrucciones en su casa: Siga las indicaciones del mdico acerca de cmo comenzar a tomar el primer ciclo de ACO. En funcin de cundo comienza a tomar la pastilla, es posible que deba usar un mtodo anticonceptivo de respaldo, como preservativos, durante la primera semana. Asegrese de saber qu hacer si se olvida de tomar la pldora. Resumen Los anticonceptivos orales (ACO) son medicamentos que se toman por va oral para evitar un embarazo. Son muy efectivos cuando se toman exactamente como se  indica. Los ACO contienen una combinacin de las hormonas estrgeno y progestina (progesterona sinttica) o progestina sola. Antes de comenzar a tomar anticonceptivos orales, debe hacerse un examen fsico, anlisis de sangre y una prueba de Papanicolaou. El mdico se asegurar de que usted sea una buena candidata para usar anticonceptivos orales. La pldora combinada puede venir en paquetes de 21, 28 o 91 das. Las pldoras de progestina sola vienen en envases de 28 pldoras. Los ACO a veces pueden causar efectos secundarios, como dolor de cabeza, nuseas, dolor a la palpacin en las mamas o sangrado irregular. Esta informacin no tiene como fin reemplazar el consejo del mdico. Asegrese de hacerle al mdico cualquier pregunta que tenga. Document Revised: 02/12/2020 Document Reviewed: 02/12/2020 Elsevier Patient Education  2022 [employer]  

## 2020-11-21 LAB — COMPREHENSIVE METABOLIC PANEL
AG Ratio: 1.4 (calc) (ref 1.0–2.5)
ALT: 12 U/L (ref 6–29)
AST: 17 U/L (ref 10–30)
Albumin: 3.9 g/dL (ref 3.6–5.1)
Alkaline phosphatase (APISO): 63 U/L (ref 31–125)
BUN: 8 mg/dL (ref 7–25)
CO2: 29 mmol/L (ref 20–32)
Calcium: 8.6 mg/dL (ref 8.6–10.2)
Chloride: 104 mmol/L (ref 98–110)
Creat: 0.63 mg/dL (ref 0.50–0.97)
Globulin: 2.8 g/dL (calc) (ref 1.9–3.7)
Glucose, Bld: 110 mg/dL — ABNORMAL HIGH (ref 65–99)
Potassium: 3.9 mmol/L (ref 3.5–5.3)
Sodium: 139 mmol/L (ref 135–146)
Total Bilirubin: 0.4 mg/dL (ref 0.2–1.2)
Total Protein: 6.7 g/dL (ref 6.1–8.1)

## 2020-11-21 LAB — LIPID PANEL
Cholesterol: 246 mg/dL — ABNORMAL HIGH (ref <200)
HDL: 45 mg/dL — ABNORMAL LOW (ref >=50)
Non-HDL Cholesterol (Calc): 201 mg/dL (calc) — ABNORMAL HIGH (ref <130)
Total CHOL/HDL Ratio: 5.5 (calc) — ABNORMAL HIGH (ref <5.0)
Triglycerides: 450 mg/dL — ABNORMAL HIGH (ref <150)

## 2020-11-21 LAB — CBC
HCT: 37.9 % (ref 35.0–45.0)
Hemoglobin: 12.1 g/dL (ref 11.7–15.5)
MCH: 27 pg (ref 27.0–33.0)
MCHC: 31.9 g/dL — ABNORMAL LOW (ref 32.0–36.0)
MCV: 84.6 fL (ref 80.0–100.0)
MPV: 9.3 fL (ref 7.5–12.5)
Platelets: 336 10*3/uL (ref 140–400)
RBC: 4.48 10*6/uL (ref 3.80–5.10)
RDW: 14.3 % (ref 11.0–15.0)
WBC: 6 10*3/uL (ref 3.8–10.8)

## 2020-11-21 LAB — TSH: TSH: 3.71 mIU/L

## 2020-11-21 LAB — HEMOGLOBIN A1C
Hgb A1c MFr Bld: 5.2 % of total Hgb (ref <5.7)
Mean Plasma Glucose: 103 mg/dL
eAG (mmol/L): 5.7 mmol/L

## 2020-11-21 LAB — PROLACTIN: Prolactin: 11.9 ng/mL

## 2020-12-08 ENCOUNTER — Ambulatory Visit (INDEPENDENT_AMBULATORY_CARE_PROVIDER_SITE_OTHER): Payer: Self-pay

## 2020-12-08 ENCOUNTER — Other Ambulatory Visit: Payer: Self-pay

## 2020-12-08 DIAGNOSIS — N83201 Unspecified ovarian cyst, right side: Secondary | ICD-10-CM

## 2020-12-15 ENCOUNTER — Telehealth: Payer: Self-pay | Admitting: Obstetrics and Gynecology

## 2020-12-15 NOTE — Telephone Encounter (Signed)
Please let the patient know that her ultrasound from last week showed resolution of her right ovarian cyst, but it showed an abnormally thickened endometrium with a possible polyp. Please ask her if she took the provera she was prescribed on 11/17/20 and if she started OCP's. She needs to return for a repeat ultrasound, sonohysterogram after she has had a cycle from either the provera or the pill. Please explain this to her and set this up. If she has recently had a cycle then just set up the sonohysterogram.  If she hasn't taken the provera yet have her take it for 5 days and set her up for an ultrasound 2.5-3 weeks later. She would need to abstain from unprotected intercourse and have a negative UPT prior to taking the provera.

## 2020-12-16 ENCOUNTER — Other Ambulatory Visit (HOSPITAL_COMMUNITY)
Admission: RE | Admit: 2020-12-16 | Discharge: 2020-12-16 | Disposition: A | Discharge status: Home / Self Care | Payer: Self-pay | Source: Ambulatory Visit | Attending: Obstetrics and Gynecology | Admitting: Obstetrics and Gynecology

## 2020-12-16 ENCOUNTER — Ambulatory Visit (INDEPENDENT_AMBULATORY_CARE_PROVIDER_SITE_OTHER): Payer: Self-pay | Admitting: Obstetrics and Gynecology

## 2020-12-16 ENCOUNTER — Encounter: Payer: Self-pay | Admitting: Obstetrics and Gynecology

## 2020-12-16 ENCOUNTER — Other Ambulatory Visit: Payer: Self-pay

## 2020-12-16 VITALS — BP 110/62 | HR 88 | Ht 62.0 in | Wt 204.0 lb

## 2020-12-16 DIAGNOSIS — N939 Abnormal uterine and vaginal bleeding, unspecified: Secondary | ICD-10-CM

## 2020-12-16 LAB — PREGNANCY, URINE: Preg Test, Ur: NEGATIVE

## 2020-12-16 NOTE — Progress Notes (Signed)
GYNECOLOGY  VISIT   HPI: 40 y.o.   Married White or Caucasian Hispanic or Latino  female   [phone] with No LMP recorded.   here for heavy bleeding and EMB. She took provera for 5 days, then started the birth control pill. She didn't bleed prior to starting the pill. She is on the last week of the pill pack. Bleeding since 8/4-8/5. Got heavy 4 days ago. Currently saturating a pad in a couple minutes at time, having gushing episodes of bleeding and slows down in between the gushing. Occurring ~ many times a day.  Feels tired, increase in headaches.   GYNECOLOGIC HISTORY: No LMP recorded. Contraception: ocp  Menopausal hormone therapy: none         OB History     Gravida  5   Para  4   Term  4   Preterm  0   AB  1   Living         SAB  1   IAB  0   Ectopic  0   Multiple      Live Births           Obstetric Comments  All vaginal            Patient Active Problem List   Diagnosis Date Noted   Abnormal uterine bleeding (AUB) 12/18/2015   Constipation 12/02/2015   Weight gain 09/26/2013   Amenorrhea 09/26/2013   Abdominal pain, left lower quadrant 09/26/2013    Past Medical History:  Diagnosis Date   Arthritis     Past Surgical History:  Procedure Laterality Date   STERILIZATION  2013   BTL    Current Outpatient Medications  Medication Sig Dispense Refill   norethindrone-ethinyl estradiol-FE (JUNEL FE 1/20) 1-20 MG-MCG tablet Take 1 tablet by mouth daily. Start on the first day of your cycle after finishing the provera 84 tablet 3   No current facility-administered medications for this visit.     ALLERGIES: Patient has no known allergies.  Family History  Problem Relation Age of Onset   Asthma Mother    Hyperlipidemia Mother    Brain cancer Father    Alzheimer's disease Paternal Grandmother     Social History   Socioeconomic History   Marital status: Married    Spouse name: Not on file   Number of children: Not on file   Years of  education: Not on file   Highest education level: Not on file  Occupational History   Not on file  Tobacco Use   Smoking status: Never   Smokeless tobacco: Never   Tobacco comments:    smoked for 1 year over 3 years ago  Vaping Use   Vaping Use: Never used  Substance and Sexual Activity   Alcohol use: No   Drug use: No   Sexual activity: Yes    Birth control/protection: Surgical  Other Topics Concern   Not on file  Social History Narrative   ** Merged History Encounter **       Social Determinants of Health   Financial Resource Strain: Not on file  Food Insecurity: Not on file  Transportation Needs: Not on file  Physical Activity: Not on file  Stress: Not on file  Social Connections: Not on file  Intimate Partner Violence: Not on file    ROS  PHYSICAL EXAMINATION:    BP 110/62 (BP Location: Right Arm, Patient Position: Sitting, Cuff Size: Large)   Pulse 88   Ht 5'  2" (1.575 m)   Wt 204 lb (92.5 kg)   SpO2 100%   BMI 37.31 kg/m     General appearance: alert, cooperative and appears stated age  Pelvic: External genitalia:  no lesions              Urethra:  normal appearing urethra with no masses, tenderness or lesions              Bartholins and Skenes: normal                 Vagina: normal appearing vagina with normal color and discharge, no lesions              Cervix: no lesions, actively bleeding  The risks of endometrial biopsy were reviewed and a consent was obtained.  A speculum was placed in the vagina and the cervix was cleansed with betadine. A tenaculum was placed on the cervix and the uterine evacuatior was placed into the endometrial cavity. The uterus sounded to ~9 cm. The endometrial biopsy was performed, taking care to get a representative sample, sampling 360 degrees of the uterine cavity. A large amount of clot and tissue was obtained, 4 passes were made. The tenaculum and speculum were removed. There were no complications.   The patient stayed  in the office for 30 minutes after the procedure, bleeding was minimal during that time.                Chaperone was present for exam.  1. Abnormal uterine bleeding (AUB) The patient had an ultrasound last week with very thickened endometrium, she took provera earlier this month and started on OCP's. She is on the placebo pills and is having a heavy w/d bleed. - CBC - Ferritin - Surgical pathology( [place]/ [facility]), large amount of tissue with office curettage.  -If her bleeding gets heavy again she will call and I will treat her with short term high dose OCP's

## 2020-12-16 NOTE — Addendum Note (Signed)
Addended by: Blima Ledger on: 12/16/2020 04:27 PM   Modules accepted: Orders

## 2020-12-16 NOTE — Patient Instructions (Signed)

## 2020-12-17 ENCOUNTER — Telehealth: Payer: Self-pay

## 2020-12-17 LAB — CBC
HCT: 33.5 % — ABNORMAL LOW (ref 35.0–45.0)
Hemoglobin: 10.9 g/dL — ABNORMAL LOW (ref 11.7–15.5)
MCH: 27.1 pg (ref 27.0–33.0)
MCHC: 32.5 g/dL (ref 32.0–36.0)
MCV: 83.3 fL (ref 80.0–100.0)
MPV: 9.3 fL (ref 7.5–12.5)
Platelets: 362 10*3/uL (ref 140–400)
RBC: 4.02 10*6/uL (ref 3.80–5.10)
RDW: 13.7 % (ref 11.0–15.0)
WBC: 8.5 10*3/uL (ref 3.8–10.8)

## 2020-12-17 LAB — FERRITIN: Ferritin: 2 ng/mL — ABNORMAL LOW (ref 16–154)

## 2020-12-17 NOTE — Telephone Encounter (Signed)
Dr. Salli Quarry result note included: "Please call Marissa [name] (with Spanish interpreter) and see if her bleeding has slowed down since her procedure yesterday."  Marissa [name] called and spoke with [name]. [name] reports very little bleeding today. Has not had to change her pad. No pain.  She was informed re: anemia and need for one a day otc iron tablet.

## 2020-12-18 ENCOUNTER — Telehealth: Payer: Self-pay | Admitting: *Deleted

## 2020-12-18 LAB — SURGICAL PATHOLOGY

## 2020-12-18 NOTE — Telephone Encounter (Signed)
-----   Message from Romualdo Bolk, MD sent at 12/18/2020 12:06 PM EDT ----- Please let the patient know that her endometrial biopsy is benign.  Please have her continue on her OCP's and f/u in 3 months. Calendar all bleeding.  Needs Spanish interpreter

## 2020-12-18 NOTE — Telephone Encounter (Signed)
Claudia informed patient.  °

## 2020-12-18 NOTE — Telephone Encounter (Signed)
Claudia informed patient, she asked does she keep her [facility] appointment on 01/21/21?

## 2020-12-18 NOTE — Telephone Encounter (Signed)
Yes

## 2021-01-08 ENCOUNTER — Other Ambulatory Visit: Payer: Self-pay | Admitting: Obstetrics and Gynecology

## 2021-01-08 DIAGNOSIS — N939 Abnormal uterine and vaginal bleeding, unspecified: Secondary | ICD-10-CM

## 2021-01-21 ENCOUNTER — Ambulatory Visit (INDEPENDENT_AMBULATORY_CARE_PROVIDER_SITE_OTHER): Payer: Self-pay

## 2021-01-21 ENCOUNTER — Ambulatory Visit (INDEPENDENT_AMBULATORY_CARE_PROVIDER_SITE_OTHER): Payer: Self-pay | Admitting: Obstetrics and Gynecology

## 2021-01-21 ENCOUNTER — Encounter: Payer: Self-pay | Admitting: Obstetrics and Gynecology

## 2021-01-21 ENCOUNTER — Other Ambulatory Visit: Payer: Self-pay

## 2021-01-21 ENCOUNTER — Telehealth: Payer: Self-pay | Admitting: Obstetrics and Gynecology

## 2021-01-21 VITALS — BP 110/62 | HR 88 | Ht 62.0 in | Wt 204.0 lb

## 2021-01-21 DIAGNOSIS — N939 Abnormal uterine and vaginal bleeding, unspecified: Secondary | ICD-10-CM

## 2021-01-21 DIAGNOSIS — N840 Polyp of corpus uteri: Secondary | ICD-10-CM

## 2021-01-21 NOTE — H&P (View-Only) (Signed)
GYNECOLOGY  VISIT   HPI: 41 y.o.   Married White or Caucasian Hispanic or Latino  female   [phone] with No LMP recorded.   here for a sonohysterogram. Ultrasound from 12/15/20 revealed an endometrial stripe of 28.6 mm with vascularity.    The patient had a cycle in 1/22, then in 6/22. She was then seen this summer with AUB, she was given provera to take and then OCP's to start. She started OCP's prior to having a w/d bleed on the provera and was seen on 12/16/20 with very heavy bleeding during the placebo week of pills. She had an office curettage that returned with benign inactive endometrium. Her hgb was 10.9 and her ferritin was 2. She was advised to start oral iron.   GYNECOLOGIC HISTORY: No LMP recorded. Contraception:Tubal ligation, OCP's Menopausal hormone therapy: NA        OB History     Gravida  5   Para  4   Term  4   Preterm  0   AB  1   Living         SAB  1   IAB  0   Ectopic  0   Multiple      Live Births           Obstetric Comments  All vaginal            Patient Active Problem List   Diagnosis Date Noted   Abnormal uterine bleeding (AUB) 12/18/2015   Constipation 12/02/2015   Weight gain 09/26/2013   Amenorrhea 09/26/2013   Abdominal pain, left lower quadrant 09/26/2013    Past Medical History:  Diagnosis Date   Arthritis     Past Surgical History:  Procedure Laterality Date   STERILIZATION  2013   BTL    Current Outpatient Medications  Medication Sig Dispense Refill   norethindrone-ethinyl estradiol-FE (JUNEL FE 1/20) 1-20 MG-MCG tablet Take 1 tablet by mouth daily. Start on the first day of your cycle after finishing the provera 84 tablet 3   No current facility-administered medications for this visit.     ALLERGIES: Patient has no known allergies.  Family History  Problem Relation Age of Onset   Asthma Mother    Hyperlipidemia Mother    Brain cancer Father    Alzheimer's disease Paternal Grandmother     Social  History   Socioeconomic History   Marital status: Married    Spouse name: Not on file   Number of children: Not on file   Years of education: Not on file   Highest education level: Not on file  Occupational History   Not on file  Tobacco Use   Smoking status: Never   Smokeless tobacco: Never   Tobacco comments:    smoked for 1 year over 3 years ago  Vaping Use   Vaping Use: Never used  Substance and Sexual Activity   Alcohol use: No   Drug use: No   Sexual activity: Yes    Birth control/protection: Surgical  Other Topics Concern   Not on file  Social History Narrative   ** Merged History Encounter **       Social Determinants of Health   Financial Resource Strain: Not on file  Food Insecurity: Not on file  Transportation Needs: Not on file  Physical Activity: Not on file  Stress: Not on file  Social Connections: Not on file  Intimate Partner Violence: Not on file    ROS  PHYSICAL EXAMINATION:  BP 110/62   Pulse 88   Ht 5\' 2"  (1.575 m)   Wt 204 lb (92.5 kg)   SpO2 98%   BMI 37.31 kg/m     General appearance: alert, cooperative and appears stated age Neck: no adenopathy, supple, symmetrical, trachea midline and thyroid normal to inspection and palpation Heart: regular rate and rhythm Lungs: CTAB Abdomen: soft, non-tender; bowel sounds normal; no masses,  no organomegaly Extremities: normal, atraumatic, no cyanosis Skin: normal color, texture and turgor, no rashes or lesions Lymph: normal cervical supraclavicular and inguinal nodes Neurologic: grossly normal   Pelvic: External genitalia:  no lesions              Urethra:  normal appearing urethra with no masses, tenderness or lesions              Bartholins and Skenes: normal                 Vagina: normal appearing vagina with normal color and discharge, no lesions              Cervix: no lesions                Pelvic ultrasound  Indications: AUB, thickened endometrium  Findings:  Anteverted  Uterus 9.58 x 5.35 x 5.46 cm  Endometrium 6.18 mm, slightly irregular  Left ovary 3.02 x 2.36 x 1.38 cm  Right ovary 2.81 x 1.96 x 2.08 cm  No free fluid  Sonohysterogram The procedure and risks of the procedure were reviewed with the patient, consent form was signed. A speculum was placed in the vagina and the cervix was cleansed with betadine. The sonohysterogram catheter was inserted into the uterine cavity without difficulty. The speculum was removed and the speculum was placed. Saline was infused under direct observation with the ultrasound. Several posterior endometrial wall abnormalities, largest was 1.4 cm and c/w with an endometrial polyp.The catheter was removed.   Impression:  Normal sized, anteverted uterus Normal ovaries bilaterally Sonohysterogram with endometrial irregularities and polyp  Chaperone was present for exam.  1. Abnormal uterine bleeding (AUB) Sonohysterogram with endometrial polyp Continue OCP's  2. Endometrial polyp Plan: hysteroscopy, polypectomy, dilation and curettage. Reviewed risks, including: bleeding, infection, uterine perforation, fluid overload, need for further sugery ACOG handouts given

## 2021-01-21 NOTE — Progress Notes (Signed)
GYNECOLOGY  VISIT   HPI: 40 y.o.   Married White or Caucasian Hispanic or Latino  female   [phone] with No LMP recorded.   here for a sonohysterogram. Ultrasound from 12/15/20 revealed an endometrial stripe of 28.6 mm with vascularity.    The patient had a cycle in 1/22, then in 6/22. She was then seen this summer with AUB, she was given provera to take and then OCP's to start. She started OCP's prior to having a w/d bleed on the provera and was seen on 12/16/20 with very heavy bleeding during the placebo week of pills. She had an office curettage that returned with benign inactive endometrium. Her hgb was 10.9 and her ferritin was 2. She was advised to start oral iron.   GYNECOLOGIC HISTORY: No LMP recorded. Contraception:Tubal ligation, OCP's Menopausal hormone therapy: NA        OB History     Gravida  5   Para  4   Term  4   Preterm  0   AB  1   Living         SAB  1   IAB  0   Ectopic  0   Multiple      Live Births           Obstetric Comments  All vaginal            Patient Active Problem List   Diagnosis Date Noted   Abnormal uterine bleeding (AUB) 12/18/2015   Constipation 12/02/2015   Weight gain 09/26/2013   Amenorrhea 09/26/2013   Abdominal pain, left lower quadrant 09/26/2013    Past Medical History:  Diagnosis Date   Arthritis     Past Surgical History:  Procedure Laterality Date   STERILIZATION  2013   BTL    Current Outpatient Medications  Medication Sig Dispense Refill   norethindrone-ethinyl estradiol-FE (JUNEL FE 1/20) 1-20 MG-MCG tablet Take 1 tablet by mouth daily. Start on the first day of your cycle after finishing the provera 84 tablet 3   No current facility-administered medications for this visit.     ALLERGIES: Patient has no known allergies.  Family History  Problem Relation Age of Onset   Asthma Mother    Hyperlipidemia Mother    Brain cancer Father    Alzheimer's disease Paternal Grandmother     Social  History   Socioeconomic History   Marital status: Married    Spouse name: Not on file   Number of children: Not on file   Years of education: Not on file   Highest education level: Not on file  Occupational History   Not on file  Tobacco Use   Smoking status: Never   Smokeless tobacco: Never   Tobacco comments:    smoked for 1 year over 3 years ago  Vaping Use   Vaping Use: Never used  Substance and Sexual Activity   Alcohol use: No   Drug use: No   Sexual activity: Yes    Birth control/protection: Surgical  Other Topics Concern   Not on file  Social History Narrative   ** Merged History Encounter **       Social Determinants of Health   Financial Resource Strain: Not on file  Food Insecurity: Not on file  Transportation Needs: Not on file  Physical Activity: Not on file  Stress: Not on file  Social Connections: Not on file  Intimate Partner Violence: Not on file    ROS  PHYSICAL EXAMINATION:  BP 110/62   Pulse 88   Ht 5\' 2"  (1.575 m)   Wt 204 lb (92.5 kg)   SpO2 98%   BMI 37.31 kg/m     General appearance: alert, cooperative and appears stated age Neck: no adenopathy, supple, symmetrical, trachea midline and thyroid normal to inspection and palpation Heart: regular rate and rhythm Lungs: CTAB Abdomen: soft, non-tender; bowel sounds normal; no masses,  no organomegaly Extremities: normal, atraumatic, no cyanosis Skin: normal color, texture and turgor, no rashes or lesions Lymph: normal cervical supraclavicular and inguinal nodes Neurologic: grossly normal   Pelvic: External genitalia:  no lesions              Urethra:  normal appearing urethra with no masses, tenderness or lesions              Bartholins and Skenes: normal                 Vagina: normal appearing vagina with normal color and discharge, no lesions              Cervix: no lesions                Pelvic ultrasound  Indications: AUB, thickened endometrium  Findings:  Anteverted  Uterus 9.58 x 5.35 x 5.46 cm  Endometrium 6.18 mm, slightly irregular  Left ovary 3.02 x 2.36 x 1.38 cm  Right ovary 2.81 x 1.96 x 2.08 cm  No free fluid  Sonohysterogram The procedure and risks of the procedure were reviewed with the patient, consent form was signed. A speculum was placed in the vagina and the cervix was cleansed with betadine. The sonohysterogram catheter was inserted into the uterine cavity without difficulty. The speculum was removed and the speculum was placed. Saline was infused under direct observation with the ultrasound. Several posterior endometrial wall abnormalities, largest was 1.4 cm and c/w with an endometrial polyp.The catheter was removed.   Impression:  Normal sized, anteverted uterus Normal ovaries bilaterally Sonohysterogram with endometrial irregularities and polyp  Chaperone was present for exam.  1. Abnormal uterine bleeding (AUB) Sonohysterogram with endometrial polyp Continue OCP's  2. Endometrial polyp Plan: hysteroscopy, polypectomy, dilation and curettage. Reviewed risks, including: bleeding, infection, uterine perforation, fluid overload, need for further sugery ACOG handouts given

## 2021-01-21 NOTE — Telephone Encounter (Signed)
Surgery: hysteroscopy, polypectomy, D&C  Diagnosis: AUB, endometrial polyp  Location: [facility]  Status: Outpatient  Time: 30 Minutes  Assistant: N/A  Urgency: At Patient's Convenience  Pre-Op Appointment: Completed  Post-Op Appointment(s): 2 Weeks,   Time Out Of Work: Day Of Surgery, 1 Day Post Op

## 2021-01-22 NOTE — Telephone Encounter (Signed)
Per Elane Fritz, spoke with patient regarding surgery benefits. Patient acknowledges understanding of information presented. Patient is aware that benefits presented are professional benefits only. Patient is aware that once surgery is scheduled, the hospital will call with separate benefits. See account note.  Routing to Carmelina Dane, Charity fundraiser, for surgery scheduling.

## 2021-01-22 NOTE — Telephone Encounter (Signed)
Marissa [name], CMA  Leda Min, RN; [place], [place] Marissa [name]  The patient chose November 1st for her sx.     Surgery request sent for 02/16/21.

## 2021-01-25 NOTE — Telephone Encounter (Signed)
Left message to call Shadae Reino, RN at [facility], [phone].  

## 2021-02-01 NOTE — Telephone Encounter (Signed)
Mailbox full, unable to leave message

## 2021-02-04 NOTE — Telephone Encounter (Signed)
Spoke with patient. Surgery date request confirmed.  Advised surgery is scheduled for 02/16/21, [facility] at 0730.  Surgery instruction sheet and hospital brochure reviewed, printed copy will be mailed.  Patient advised if Covid screening and quarantine requirements and agreeable.   Routing to provider. Encounter closed.  Cc: Hayley Carder

## 2021-02-08 ENCOUNTER — Encounter (HOSPITAL_BASED_OUTPATIENT_CLINIC_OR_DEPARTMENT_OTHER): Payer: Self-pay | Admitting: Obstetrics and Gynecology

## 2021-02-08 ENCOUNTER — Other Ambulatory Visit: Payer: Self-pay

## 2021-02-08 DIAGNOSIS — D649 Anemia, unspecified: Secondary | ICD-10-CM

## 2021-02-08 HISTORY — DX: Anemia, unspecified: D64.9

## 2021-02-08 NOTE — Progress Notes (Addendum)
Spoke w/ via phone for pre-op interview---pt with spanish pacific interpreters number [phone] Lab needs dos----  urine poct           Lab results------none COVID test -----patient states asymptomatic no test needed Arrive at -------530 am 02-16-2021 NPO after MN NO Solid Food.   Water  from MN until---430 am then npo Med rec completed Medications to take morning of surgery -----junel, pantaprazole Diabetic medication -----n/a Patient instructed no nail polish to be worn day of surgery Patient instructed to bring photo id and insurance card day of surgery Patient aware to have Driver (ride ) / caregiver    for 24 hours after surgery spouse jonathan Patient Special Instructions -----none Pre-Op special Istructions ----- None Patient verbalized understanding of instructions that were given at this phone interview. Patient denies shortness of breath, chest pain, fever, cough at this phone interview.   Spanish interpreter requested for dos (no gender preference) email on chart

## 2021-02-16 ENCOUNTER — Encounter (HOSPITAL_BASED_OUTPATIENT_CLINIC_OR_DEPARTMENT_OTHER): Payer: Self-pay | Admitting: Obstetrics and Gynecology

## 2021-02-16 ENCOUNTER — Ambulatory Visit (HOSPITAL_BASED_OUTPATIENT_CLINIC_OR_DEPARTMENT_OTHER): Payer: Self-pay | Admitting: Anesthesiology

## 2021-02-16 ENCOUNTER — Encounter (HOSPITAL_BASED_OUTPATIENT_CLINIC_OR_DEPARTMENT_OTHER)
Admission: RE | Disposition: A | Discharge status: Home / Self Care | Payer: Self-pay | Source: Home / Self Care | Attending: Obstetrics and Gynecology

## 2021-02-16 ENCOUNTER — Ambulatory Visit (HOSPITAL_BASED_OUTPATIENT_CLINIC_OR_DEPARTMENT_OTHER)
Admission: RE | Admit: 2021-02-16 | Discharge: 2021-02-16 | Disposition: A | Discharge status: Home / Self Care | Payer: Self-pay | Attending: Obstetrics and Gynecology | Admitting: Obstetrics and Gynecology

## 2021-02-16 DIAGNOSIS — N939 Abnormal uterine and vaginal bleeding, unspecified: Secondary | ICD-10-CM | POA: Insufficient documentation

## 2021-02-16 DIAGNOSIS — Z793 Long term (current) use of hormonal contraceptives: Secondary | ICD-10-CM | POA: Insufficient documentation

## 2021-02-16 DIAGNOSIS — N840 Polyp of corpus uteri: Secondary | ICD-10-CM | POA: Insufficient documentation

## 2021-02-16 DIAGNOSIS — Z87891 Personal history of nicotine dependence: Secondary | ICD-10-CM | POA: Insufficient documentation

## 2021-02-16 DIAGNOSIS — D649 Anemia, unspecified: Secondary | ICD-10-CM | POA: Insufficient documentation

## 2021-02-16 DIAGNOSIS — K219 Gastro-esophageal reflux disease without esophagitis: Secondary | ICD-10-CM | POA: Insufficient documentation

## 2021-02-16 HISTORY — DX: Abnormal uterine and vaginal bleeding, unspecified: N93.9

## 2021-02-16 HISTORY — PX: DILATATION & CURETTAGE/HYSTEROSCOPY WITH MYOSURE: SHX6511

## 2021-02-16 HISTORY — DX: Presence of spectacles and contact lenses: Z97.3

## 2021-02-16 HISTORY — DX: Gastro-esophageal reflux disease without esophagitis: K21.9

## 2021-02-16 LAB — POCT PREGNANCY, URINE: Preg Test, Ur: NEGATIVE

## 2021-02-16 SURGERY — DILATATION & CURETTAGE/HYSTEROSCOPY WITH MYOSURE
Anesthesia: General | Site: Uterus

## 2021-02-16 MED ORDER — SODIUM CHLORIDE 0.9 % IR SOLN
Status: DC | PRN
Start: 2021-02-16 — End: 2021-02-16
  Administered 2021-02-16: 3000 mL

## 2021-02-16 MED ORDER — ONDANSETRON HCL 4 MG/2ML IJ SOLN
INTRAMUSCULAR | Status: AC
  Filled 2021-02-16: qty 2

## 2021-02-16 MED ORDER — ACETAMINOPHEN 500 MG PO TABS
1000.0000 mg | ORAL_TABLET | ORAL | Status: AC
  Administered 2021-02-16: 1000 mg via ORAL

## 2021-02-16 MED ORDER — ACETAMINOPHEN 500 MG PO TABS
ORAL_TABLET | ORAL | Status: AC
  Filled 2021-02-16: qty 2

## 2021-02-16 MED ORDER — PROPOFOL 10 MG/ML IV BOLUS
INTRAVENOUS | Status: AC
  Filled 2021-02-16: qty 20

## 2021-02-16 MED ORDER — FENTANYL CITRATE (PF) 250 MCG/5ML IJ SOLN
INTRAMUSCULAR | Status: AC
  Filled 2021-02-16: qty 5

## 2021-02-16 MED ORDER — WHITE PETROLATUM EX OINT
TOPICAL_OINTMENT | CUTANEOUS | Status: AC
  Filled 2021-02-16: qty 10

## 2021-02-16 MED ORDER — LIDOCAINE 2% (20 MG/ML) 5 ML SYRINGE
INTRAMUSCULAR | Status: DC | PRN
  Administered 2021-02-16: 60 mg via INTRAVENOUS

## 2021-02-16 MED ORDER — LACTATED RINGERS IV SOLN: INTRAVENOUS | Status: DC

## 2021-02-16 MED ORDER — FENTANYL CITRATE (PF) 100 MCG/2ML IJ SOLN
INTRAMUSCULAR | Status: DC | PRN
  Administered 2021-02-16 (×2): 50 ug via INTRAVENOUS

## 2021-02-16 MED ORDER — PROPOFOL 10 MG/ML IV BOLUS
INTRAVENOUS | Status: DC | PRN
  Administered 2021-02-16: 200 mg via INTRAVENOUS

## 2021-02-16 MED ORDER — ONDANSETRON HCL 4 MG/2ML IJ SOLN
INTRAMUSCULAR | Status: DC | PRN
  Administered 2021-02-16: 4 mg via INTRAVENOUS

## 2021-02-16 MED ORDER — LIDOCAINE 2% (20 MG/ML) 5 ML SYRINGE
INTRAMUSCULAR | Status: AC
  Filled 2021-02-16: qty 5

## 2021-02-16 MED ORDER — MIDAZOLAM HCL 2 MG/2ML IJ SOLN
INTRAMUSCULAR | Status: AC
  Filled 2021-02-16: qty 2

## 2021-02-16 MED ORDER — SCOPOLAMINE 1 MG/3DAYS TD PT72
1.0000 | MEDICATED_PATCH | TRANSDERMAL | Status: DC
  Administered 2021-02-16: 1.5 mg via TRANSDERMAL

## 2021-02-16 MED ORDER — DEXAMETHASONE SODIUM PHOSPHATE 10 MG/ML IJ SOLN
INTRAMUSCULAR | Status: DC | PRN
  Administered 2021-02-16: 10 mg via INTRAVENOUS

## 2021-02-16 MED ORDER — MIDAZOLAM HCL 5 MG/5ML IJ SOLN
INTRAMUSCULAR | Status: DC | PRN
  Administered 2021-02-16: 2 mg via INTRAVENOUS

## 2021-02-16 MED ORDER — KETOROLAC TROMETHAMINE 30 MG/ML IJ SOLN
INTRAMUSCULAR | Status: DC | PRN
  Administered 2021-02-16: 30 mg via INTRAVENOUS

## 2021-02-16 MED ORDER — SCOPOLAMINE 1 MG/3DAYS TD PT72
MEDICATED_PATCH | TRANSDERMAL | Status: AC
  Filled 2021-02-16: qty 1

## 2021-02-16 MED ORDER — DEXAMETHASONE SODIUM PHOSPHATE 10 MG/ML IJ SOLN
INTRAMUSCULAR | Status: AC
  Filled 2021-02-16: qty 1

## 2021-02-16 SURGICAL SUPPLY — 22 items
CATH ROBINSON RED A/P 16FR (CATHETERS) IMPLANT
DEVICE MYOSURE LITE (MISCELLANEOUS) IMPLANT
DEVICE MYOSURE REACH (MISCELLANEOUS) IMPLANT
DILATOR CANAL MILEX (MISCELLANEOUS) IMPLANT
DRSG TELFA 3X8 NADH (GAUZE/BANDAGES/DRESSINGS) ×2 IMPLANT
GAUZE 4X4 16PLY ~~LOC~~+RFID DBL (SPONGE) ×4 IMPLANT
GLOVE SURG ENC MOIS LTX SZ6.5 (GLOVE) ×2 IMPLANT
GOWN STRL REUS W/TWL LRG LVL3 (GOWN DISPOSABLE) ×2 IMPLANT
IV NS IRRIG 3000ML ARTHROMATIC (IV SOLUTION) ×2 IMPLANT
KIT PROCEDURE FLUENT (KITS) ×2 IMPLANT
KIT TURNOVER CYSTO (KITS) ×2 IMPLANT
MYOSURE XL FIBROID (MISCELLANEOUS)
PACK VAGINAL MINOR WOMEN LF (CUSTOM PROCEDURE TRAY) ×2 IMPLANT
PAD DRESSING TELFA 3X8 NADH (GAUZE/BANDAGES/DRESSINGS) IMPLANT
PAD OB MATERNITY 4.3X12.25 (PERSONAL CARE ITEMS) ×2 IMPLANT
PAD PREP 24X48 CUFFED NSTRL (MISCELLANEOUS) ×2 IMPLANT
SEAL CERVICAL OMNI LOK (ABLATOR) IMPLANT
SEAL ROD LENS SCOPE MYOSURE (ABLATOR) ×2 IMPLANT
SYR 20ML LL LF (SYRINGE) ×1 IMPLANT
SYSTEM TISS REMOVAL MYOSURE XL (MISCELLANEOUS) IMPLANT
TOWEL OR 17X26 10 PK STRL BLUE (TOWEL DISPOSABLE) ×2 IMPLANT
WATER STERILE IRR 500ML POUR (IV SOLUTION) IMPLANT

## 2021-02-16 NOTE — Anesthesia Preprocedure Evaluation (Signed)
Anesthesia Evaluation  Patient identified by MRN, date of birth, ID band Patient awake    Reviewed: Allergy & Precautions, NPO status , Patient's Chart, lab work & pertinent test results  Airway Mallampati: II  TM Distance: >3 FB Neck ROM: Full    Dental  (+) Dental Advisory Given   Pulmonary former smoker,    breath sounds clear to auscultation       Cardiovascular negative cardio ROS   Rhythm:Regular Rate:Normal     Neuro/Psych negative neurological ROS     GI/Hepatic Neg liver ROS, GERD  ,  Endo/Other  negative endocrine ROS  Renal/GU negative Renal ROS     Musculoskeletal   Abdominal   Peds  Hematology  (+) anemia ,   Anesthesia Other Findings   Reproductive/Obstetrics                             Anesthesia Physical Anesthesia Plan  ASA: 2  Anesthesia Plan: General   Post-op Pain Management:    Induction: Intravenous  PONV Risk Score and Plan: 4 or greater and Midazolam, Dexamethasone, Ondansetron, Scopolamine patch - Pre-op and Treatment may vary due to age or medical condition  Airway Management Planned: LMA  Additional Equipment: None  Intra-op Plan:   Post-operative Plan: Extubation in OR  Informed Consent: I have reviewed the patients History and Physical, chart, labs and discussed the procedure including the risks, benefits and alternatives for the proposed anesthesia with the patient or authorized representative who has indicated his/her understanding and acceptance.     Dental advisory given  Plan Discussed with: CRNA  Anesthesia Plan Comments:         Anesthesia Quick Evaluation

## 2021-02-16 NOTE — Discharge Instructions (Addendum)
NO TYLENOL CONTAINING PRODUCTS UNTIL AFTER 1230 PM TODAY.  NO IBUPROFEN/ADVIL/MOTRIN/ALEVE/NAPROXEN UNTIL AFTER 1:45 PM TODAY.    Post Anesthesia Home Care Instructions  Activity: Get plenty of rest for the remainder of the day. A responsible individual must stay with you for 24 hours following the procedure.  For the next 24 hours, DO NOT: -Drive a car -Advertising copywriter -Drink alcoholic beverages -Take any medication unless instructed by your physician -Make any legal decisions or sign important papers.  Meals: Start with liquid foods such as gelatin or soup. Progress to regular foods as tolerated. Avoid greasy, spicy, heavy foods. If nausea and/or vomiting occur, drink only clear liquids until the nausea and/or vomiting subsides. Call your physician if vomiting continues.  Special Instructions/Symptoms: Your throat may feel dry or sore from the anesthesia or the breathing tube placed in your throat during surgery. If this causes discomfort, gargle with warm salt water. The discomfort should disappear within 24 hours.  If you had a scopolamine patch placed behind your ear for the management of post- operative nausea and/or vomiting:  1. The medication in the patch is effective for 72 hours, after which it should be removed.  Wrap patch in a tissue and discard in the trash. Wash hands thoroughly with soap and water. 2. You may remove the patch earlier than 72 hours if you experience unpleasant side effects which may include dry mouth, dizziness or visual disturbances. 3. Avoid touching the patch. Wash your hands with soap and water after contact with the patch.

## 2021-02-16 NOTE — Transfer of Care (Signed)
Immediate Anesthesia Transfer of Care Note  Patient: Marissa [name]  Procedure(s) Performed: DILATATION & CURETTAGE/HYSTEROSCOPY WITH MYOSURE (Uterus)  Patient Location: PACU  Anesthesia Type:General  Level of Consciousness: awake and patient cooperative  Airway & Oxygen Therapy: Patient Spontanous Breathing  Post-op Assessment: Report given to RN and Post -op Vital signs reviewed and stable  Post vital signs: Reviewed and stable  Last Vitals:  Vitals Value Taken Time  BP 160/114 02/16/21 0756  Temp    Pulse 89 02/16/21 0758  Resp 18 02/16/21 0758  SpO2 96 % 02/16/21 0758  Vitals shown include unvalidated device data.  Last Pain:  Vitals:   02/16/21 0619  TempSrc: Oral  PainSc: 0-No pain         Complications: No notable events documented.

## 2021-02-16 NOTE — Op Note (Signed)
Preoperative Diagnosis: abnormal uterine bleeding, amenia  Postoperative Diagnosis: same  Procedure: Hysteroscopy, dilation and curettage  Surgeon: Dr Gertie Exon  Assistants: None  Anesthesia: General via LMA  EBL: 10 cc  Fluids: 400 cc  Fluid deficit: 170 cc  Urine output: not recorded  Indications for surgery: The patient is a 40 yo female, who presented with abnormal uterine bleeding and anemia. Work up included a benign endometrial biopsy and a sonohysterogram with endometrial abnormalities consistent with polyps.  The risks of the surgery were reviewed with the patient and the consent form was signed prior to her surgery.  Findings: anteverted, mobile uterus, no adnexal masses. Thickened, polypoid appearing endometrium, normal tubal ostia bilaterally  Specimens: endometrial curetting's, endometrial polyp   Procedure: The patient was taken to the operating room with an IV in place. She was placed in the dorsal lithotomy position and anesthesia was administered. She was prepped and draped in the usual sterile fashion for a vaginal procedure. She voided on the way to the OR. A weighted speculum was placed in the vagina and a single tooth tenaculum was placed on the anterior lip of the cervix. The cervix was dilated to a #19 pratt dilator. The uterus was sounded to 9 cm. The myosure hysteroscope was inserted into the uterine cavity. With continuous infusion of normal saline, the uterine cavity was visualized with the above findings. The myosure reach was used to resect the polypoid endometrium. The myosure was then removed. The cavity was then curetted with the small sharp curette. The cavity had the characteristically gritty texture at the end of the procedure. The curette and the single tooth tenaculum were removed. The speculum was removed. The patients perineum was cleansed of betadine and she was taken out of the dorsal lithotomy position.  Upon awakening the was removed and the  patient was transferred to the recovery room in stable and awake condition.  The sponge and instrument count were correct. There were no complications.

## 2021-02-16 NOTE — Anesthesia Postprocedure Evaluation (Signed)
Anesthesia Post Note  Patient: Marissa [name]  Procedure(s) Performed: DILATATION & CURETTAGE/HYSTEROSCOPY WITH MYOSURE (Uterus)     Patient location during evaluation: PACU Anesthesia Type: General Level of consciousness: awake and alert Pain management: pain level controlled Vital Signs Assessment: post-procedure vital signs reviewed and stable Respiratory status: spontaneous breathing, nonlabored ventilation, respiratory function stable and patient connected to nasal cannula oxygen Cardiovascular status: blood pressure returned to baseline and stable Postop Assessment: no apparent nausea or vomiting Anesthetic complications: no   No notable events documented.  Last Vitals:  Vitals:   02/16/21 0830 02/16/21 0910  BP: 122/78 114/80  Pulse: 64 65  Resp: 13 16  Temp:  36.7 C  SpO2: 97% 97%    Last Pain:  Vitals:   02/16/21 0910  TempSrc:   PainSc: 0-No pain                 [name]

## 2021-02-16 NOTE — Interval H&P Note (Signed)
History and Physical Interval Note:  02/16/2021 7:10 AM  Marissa [name]  has presented today for surgery, with the diagnosis of abnormal uterine bleeding, endometrial polyp.  The various methods of treatment have been discussed with the patient and family. After consideration of risks, benefits and other options for treatment, the patient has consented to  Procedure(s): DILATATION & CURETTAGE/HYSTEROSCOPY WITH MYOSURE (N/A) as a surgical intervention.  The patient's history has been reviewed, patient examined, no change in status, stable for surgery.  I have reviewed the patient's chart and labs.  Questions were answered to the patient's satisfaction.     Romualdo Bolk

## 2021-02-16 NOTE — Anesthesia Procedure Notes (Signed)
Procedure Name: LMA Insertion Date/Time: 02/16/2021 7:30 AM Performed by: Bishop Limbo, CRNA Pre-anesthesia Checklist: Patient identified, Emergency Drugs available, Suction available and Patient being monitored Patient Re-evaluated:Patient Re-evaluated prior to induction Oxygen Delivery Method: Circle System Utilized Preoxygenation: Pre-oxygenation with 100% oxygen Induction Type: IV induction Ventilation: Mask ventilation without difficulty LMA: LMA inserted LMA Size: 4.0 Number of attempts: 1 Airway Equipment and Method: Bite block Placement Confirmation: positive ETCO2 Tube secured with: Tape Dental Injury: Teeth and Oropharynx as per pre-operative assessment

## 2021-02-17 ENCOUNTER — Encounter (HOSPITAL_BASED_OUTPATIENT_CLINIC_OR_DEPARTMENT_OTHER): Payer: Self-pay | Admitting: Obstetrics and Gynecology

## 2021-02-17 LAB — SURGICAL PATHOLOGY

## 2021-03-03 ENCOUNTER — Ambulatory Visit (INDEPENDENT_AMBULATORY_CARE_PROVIDER_SITE_OTHER): Payer: Self-pay | Admitting: Obstetrics and Gynecology

## 2021-03-03 ENCOUNTER — Other Ambulatory Visit: Payer: Self-pay

## 2021-03-03 ENCOUNTER — Encounter: Payer: Self-pay | Admitting: Obstetrics and Gynecology

## 2021-03-03 VITALS — BP 100/72 | HR 72 | Wt 209.0 lb

## 2021-03-03 DIAGNOSIS — Z9889 Other specified postprocedural states: Secondary | ICD-10-CM

## 2021-03-03 DIAGNOSIS — Z3041 Encounter for surveillance of contraceptive pills: Secondary | ICD-10-CM

## 2021-03-03 NOTE — Progress Notes (Signed)
GYNECOLOGY  VISIT   HPI: 40 y.o.   Married White or Caucasian Hispanic or Latino  female   [phone] with Patient's last menstrual period was 03/01/2021 (approximate).   here for 2 week post of following a hysteroscopy, D&C. Pathology returned with secretory endometrium and a benign endometrial polyp. Patient states that her bleeding is so much better, no c/o.  She is on OCP's, started prior to her surgery.  GYNECOLOGIC HISTORY: Patient's last menstrual period was 03/01/2021 (approximate). Contraception: OCP  Menopausal hormone therapy: none         OB History     Gravida  5   Para  4   Term  4   Preterm  0   AB  1   Living         SAB  1   IAB  0   Ectopic  0   Multiple      Live Births           Obstetric Comments  All vaginal            Patient Active Problem List   Diagnosis Date Noted   Abnormal uterine bleeding (AUB) 12/18/2015   Constipation 12/02/2015   Weight gain 09/26/2013   Amenorrhea 09/26/2013   Abdominal pain, left lower quadrant 09/26/2013    Past Medical History:  Diagnosis Date   Abnormal uterine bleeding (AUB)    Anemia 02/08/2021   took iron in past   GERD (gastroesophageal reflux disease)    Wears glasses    for reading    Past Surgical History:  Procedure Laterality Date   DILATATION & CURETTAGE/HYSTEROSCOPY WITH MYOSURE N/A 02/16/2021   Procedure: DILATATION & CURETTAGE/HYSTEROSCOPY WITH MYOSURE;  Surgeon: Romualdo Bolk, MD;  Location: [facility] [place];  Service: Gynecology;  Laterality: N/A;   STERILIZATION  2013   BTL    Current Outpatient Medications  Medication Sig Dispense Refill   norethindrone-ethinyl estradiol-FE (JUNEL FE 1/20) 1-20 MG-MCG tablet Take 1 tablet by mouth daily. Start on the first day of your cycle after finishing the provera 84 tablet 3   pantoprazole (PROTONIX) 20 MG tablet Take 20 mg by mouth daily.     No current facility-administered medications for this visit.      ALLERGIES: Patient has no known allergies.  Family History  Problem Relation Age of Onset   Asthma Mother    Hyperlipidemia Mother    Brain cancer Father    Alzheimer's disease Paternal Grandmother     Social History   Socioeconomic History   Marital status: Married    Spouse name: Not on file   Number of children: Not on file   Years of education: Not on file   Highest education level: Not on file  Occupational History   Not on file  Tobacco Use   Smoking status: Former    Years: 1.00    Types: Cigarettes   Smokeless tobacco: Never   Tobacco comments:    smoked for 1 year over 3 years ago  Vaping Use   Vaping Use: Never used  Substance and Sexual Activity   Alcohol use: No   Drug use: No   Sexual activity: Yes    Birth control/protection: Surgical  Other Topics Concern   Not on file  Social History Narrative   ** Merged History Encounter **       Social Determinants of Health   Financial Resource Strain: Not on file  Food Insecurity: Not on file  Transportation Needs: Not on file  Physical Activity: Not on file  Stress: Not on file  Social Connections: Not on file  Intimate Partner Violence: Not on file    Review of Systems  All other systems reviewed and are negative.  PHYSICAL EXAMINATION:    BP 100/72   Pulse 72   Wt 209 lb (94.8 kg)   LMP 03/01/2021 (Approximate)   SpO2 98%   BMI 38.23 kg/m     General appearance: alert, cooperative and appears stated age  87. History of hysteroscopy Being pathology   2. Encounter for surveillance of contraceptive pills Doing well on OCP's. Prior to her abnormal bleeding she had prolonged episodes of amenorrhea.  She will stay on OCP' Routine f/u

## 2021-03-24 ENCOUNTER — Ambulatory Visit: Payer: Self-pay | Admitting: Obstetrics and Gynecology

## 2021-03-24 DIAGNOSIS — Z0289 Encounter for other administrative examinations: Secondary | ICD-10-CM

## 2021-03-24 NOTE — Progress Notes (Deleted)
GYNECOLOGY  VISIT   HPI: 40 y.o.   Married White or Caucasian Hispanic or Latino  female   [phone] with Patient's last menstrual period was 03/01/2021 (approximate).   here for 3 month follow up starting OCP    GYNECOLOGIC HISTORY: Patient's last menstrual period was 03/01/2021 (approximate). Contraception:OCP  Menopausal hormone therapy: none        OB History     Gravida  5   Para  4   Term  4   Preterm  0   AB  1   Living         SAB  1   IAB  0   Ectopic  0   Multiple      Live Births           Obstetric Comments  All vaginal            Patient Active Problem List   Diagnosis Date Noted   Abnormal uterine bleeding (AUB) 12/18/2015   Constipation 12/02/2015   Weight gain 09/26/2013   Amenorrhea 09/26/2013   Abdominal pain, left lower quadrant 09/26/2013    Past Medical History:  Diagnosis Date   Abnormal uterine bleeding (AUB)    Anemia 02/08/2021   took iron in past   GERD (gastroesophageal reflux disease)    Wears glasses    for reading    Past Surgical History:  Procedure Laterality Date   DILATATION & CURETTAGE/HYSTEROSCOPY WITH MYOSURE N/A 02/16/2021   Procedure: DILATATION & CURETTAGE/HYSTEROSCOPY WITH MYOSURE;  Surgeon: Romualdo Bolk, MD;  Location: [facility] [place];  Service: Gynecology;  Laterality: N/A;   STERILIZATION  2013   BTL    Current Outpatient Medications  Medication Sig Dispense Refill   norethindrone-ethinyl estradiol-FE (JUNEL FE 1/20) 1-20 MG-MCG tablet Take 1 tablet by mouth daily. Start on the first day of your cycle after finishing the provera 84 tablet 3   pantoprazole (PROTONIX) 20 MG tablet Take 20 mg by mouth daily.     No current facility-administered medications for this visit.     ALLERGIES: Patient has no known allergies.  Family History  Problem Relation Age of Onset   Asthma Mother    Hyperlipidemia Mother    Brain cancer Father    Alzheimer's disease Paternal Grandmother      Social History   Socioeconomic History   Marital status: Married    Spouse name: Not on file   Number of children: Not on file   Years of education: Not on file   Highest education level: Not on file  Occupational History   Not on file  Tobacco Use   Smoking status: Former    Years: 1.00    Types: Cigarettes   Smokeless tobacco: Never   Tobacco comments:    smoked for 1 year over 3 years ago  Vaping Use   Vaping Use: Never used  Substance and Sexual Activity   Alcohol use: No   Drug use: No   Sexual activity: Yes    Birth control/protection: Surgical  Other Topics Concern   Not on file  Social History Narrative   ** Merged History Encounter **       Social Determinants of Health   Financial Resource Strain: Not on file  Food Insecurity: Not on file  Transportation Needs: Not on file  Physical Activity: Not on file  Stress: Not on file  Social Connections: Not on file  Intimate Partner Violence: Not on file    ROS  PHYSICAL EXAMINATION:    LMP 03/01/2021 (Approximate)     General appearance: alert, cooperative and appears stated age Neck: no adenopathy, supple, symmetrical, trachea midline and thyroid {CHL AMB PHY EX THYROID NORM DEFAULT:[phone]::"normal to inspection and palpation"} Breasts: {Exam; breast:13139::"normal appearance, no masses or tenderness"} Abdomen: soft, non-tender; non distended, no masses,  no organomegaly  Pelvic: External genitalia:  no lesions              Urethra:  normal appearing urethra with no masses, tenderness or lesions              Bartholins and Skenes: normal                 Vagina: normal appearing vagina with normal color and discharge, no lesions              Cervix: {CHL AMB PHY EX CERVIX NORM [phone]::"no lesions"}              Bimanual Exam:  Uterus:  {CHL AMB PHY EX UTERUS NORM [phone]::"normal size, contour, position, consistency, mobility, non-tender"}              Adnexa: {CHL AMB PHY  EX ADNEXA NO MASS DEFAULT:[phone]::"no mass, fullness, tenderness"}              Rectovaginal: {yes no:[phone]}.  Confirms.              Anus:  normal sphincter tone, no lesions  Chaperone was present for exam.  ASSESSMENT     PLAN    An After Visit Summary was printed and given to the patient.  *** minutes face to face time of which over 50% was spent in counseling.

## 2021-03-31 ENCOUNTER — Other Ambulatory Visit: Payer: Self-pay | Admitting: Gastroenterology

## 2021-03-31 ENCOUNTER — Other Ambulatory Visit (HOSPITAL_COMMUNITY): Payer: Self-pay | Admitting: Gastroenterology

## 2021-03-31 DIAGNOSIS — R1011 Right upper quadrant pain: Secondary | ICD-10-CM

## 2021-04-26 ENCOUNTER — Encounter (HOSPITAL_COMMUNITY): Payer: Self-pay

## 2021-04-26 ENCOUNTER — Ambulatory Visit (HOSPITAL_COMMUNITY): Payer: Self-pay | Attending: Gastroenterology

## 2021-06-22 ENCOUNTER — Ambulatory Visit (INDEPENDENT_AMBULATORY_CARE_PROVIDER_SITE_OTHER): Payer: Self-pay | Admitting: Radiology

## 2021-06-22 ENCOUNTER — Encounter: Payer: Self-pay | Admitting: Radiology

## 2021-06-22 ENCOUNTER — Other Ambulatory Visit: Payer: Self-pay

## 2021-06-22 VITALS — BP 120/82

## 2021-06-22 DIAGNOSIS — R102 Pelvic and perineal pain: Secondary | ICD-10-CM

## 2021-06-23 ENCOUNTER — Ambulatory Visit: Payer: Self-pay | Admitting: Obstetrics and Gynecology

## 2021-06-23 ENCOUNTER — Encounter: Payer: Self-pay | Admitting: Radiology

## 2021-06-23 NOTE — Progress Notes (Signed)
? ? ? ? ?  Marissa [name] is 41 y.o. female presenting with complaint of pelvic pain x 1 week. Accompanied by her husband. Pain began in the right lower quadrant and she now experiences some in the left as well. Describes it as a pulling that radiates to her back. She denies any urinary or vaginal symptoms. Tylenol helps with pain.  ? ?LMP: 05/23/21  ?Periods are regular.  ?Dysmenorrhea no.  ?Dyspareunia no.  ? ?Review of Systems - History obtained from the patient  ?Past medical history:I have reviewed and confirmed the past medical history in the chart. ?Medications: reviewed medication list in the chart ?Allergies: reviewed allergy section in the chart ?Review of Systems: See HPI for pertinent positives, ?Review of all other systems is negative  ? ?Objective:  ?Pelvic exam: normal external genitalia, vulva, vagina, cervix, uterus and adnexa, VULVA: normal appearing vulva with no masses, tenderness or lesions, VAGINA: normal appearing vagina with normal color and discharge, no lesions, CERVIX: normal appearing cervix without discharge or lesions, UTERUS: uterus is normal size, shape, consistency and nontender, ADNEXA: normal adnexa in size, nontender and no masses. ?ABDOMINAL: tender over the hip flexors bilaterally ? ?Chaperone offered and declined for exam. ?Urine dipstick shows negative for all components.  Micro exam: 0-5 WBC's per HPF and few+ bacteria.  ? ?Assessment/Plan: ?1. Pelvic pain ?Reassured patient pain is likely musculoskeletal. Ibuprofen 800mg  every 8 hrs prn, heat, stretching. Reviewed stretches with patient she can do, she demonstrated back to me. ? ?- Pregnancy, urine negative ?- Urinalysis,Complete w/RFL Culture  ?  ?

## 2021-06-24 ENCOUNTER — Other Ambulatory Visit: Payer: Self-pay | Admitting: Radiology

## 2021-06-24 DIAGNOSIS — N3000 Acute cystitis without hematuria: Secondary | ICD-10-CM

## 2021-06-24 LAB — URINALYSIS, COMPLETE W/RFL CULTURE
Bilirubin Urine: NEGATIVE
Glucose, UA: NEGATIVE
Hgb urine dipstick: NEGATIVE
Hyaline Cast: NONE SEEN /LPF
Ketones, ur: NEGATIVE
Leukocyte Esterase: NEGATIVE
Nitrites, Initial: NEGATIVE
Protein, ur: NEGATIVE
RBC / HPF: NONE SEEN /HPF (ref 0–2)
Specific Gravity, Urine: 1.01 (ref 1.001–1.035)
pH: 7 (ref 5.0–8.0)

## 2021-06-24 LAB — URINE CULTURE
MICRO NUMBER:: 13098086
SPECIMEN QUALITY:: ADEQUATE

## 2021-06-24 LAB — CULTURE INDICATED

## 2021-06-24 LAB — PREGNANCY, URINE: Preg Test, Ur: NEGATIVE

## 2021-06-24 MED ORDER — NITROFURANTOIN MONOHYD MACRO 100 MG PO CAPS: 100.0000 mg | ORAL_CAPSULE | Freq: Two times a day (BID) | ORAL | 0 refills | Status: DC

## 2021-07-12 ENCOUNTER — Ambulatory Visit (HOSPITAL_COMMUNITY)
Admission: EM | Admit: 2021-07-12 | Discharge: 2021-07-12 | Disposition: A | Discharge status: Home / Self Care | Payer: Self-pay | Attending: Internal Medicine | Admitting: Internal Medicine

## 2021-07-12 DIAGNOSIS — S61210A Laceration without foreign body of right index finger without damage to nail, initial encounter: Secondary | ICD-10-CM

## 2021-07-12 MED ORDER — TETANUS-DIPHTH-ACELL PERTUSSIS 5-2.5-18.5 LF-MCG/0.5 IM SUSY: 0.5000 mL | PREFILLED_SYRINGE | Freq: Once | INTRAMUSCULAR | Status: DC

## 2021-07-12 MED ORDER — MUPIROCIN CALCIUM 2 % EX CREA: 1.0000 "application " | TOPICAL_CREAM | Freq: Two times a day (BID) | CUTANEOUS | 0 refills | Status: DC

## 2021-07-12 NOTE — Discharge Instructions (Signed)
Please apply topical antibiotic ointment with daily wound dressing changes ?After 96 hours you may do nonocclusive dressing ?If you notice worsening pain, swelling, redness or discharge please return to urgent care to be reevaluated ?Return to urgent care in 7 to 10 days for suture removal. ?

## 2021-07-12 NOTE — ED Triage Notes (Signed)
Onset today pt was washing dishes and cut first finger on right hand.  ?

## 2021-07-12 NOTE — ED Notes (Signed)
Pt called x 2 for return for tdap. HIPAA compliant VM left.  ?

## 2021-07-12 NOTE — ED Provider Notes (Addendum)
?[facility] ? ? ? ?CSN: [id number] ?Arrival date & time: 07/12/21  1253 ? ? ?  ? ?History   ?Chief Complaint ?Chief Complaint  ?Patient presents with  ? Laceration  ? ? ?HPI ?Marissa [name] is a 41 y.o. female comes to urgent care with laceration over the right index finger.  Patient was washing dishes but sustained a laceration from a broken glass.  Bleeding is controlled.  Last Tdap was more than 10 years ago.  No numbness or tingling of the finger..  ? ?HPI ? ?Past Medical History:  ?Diagnosis Date  ? Abnormal uterine bleeding (AUB)   ? Anemia 02/08/2021  ? took iron in past  ? GERD (gastroesophageal reflux disease)   ? Wears glasses   ? for reading  ? ? ?Patient Active Problem List  ? Diagnosis Date Noted  ? Abnormal uterine bleeding (AUB) 12/18/2015  ? Constipation 12/02/2015  ? Weight gain 09/26/2013  ? Amenorrhea 09/26/2013  ? Abdominal pain, left lower quadrant 09/26/2013  ? ? ?Past Surgical History:  ?Procedure Laterality Date  ? DILATATION & CURETTAGE/HYSTEROSCOPY WITH MYOSURE N/A 02/16/2021  ? Procedure: DILATATION & CURETTAGE/HYSTEROSCOPY WITH MYOSURE;  Surgeon: Romualdo Bolk, MD;  Location: [facility];  Service: Gynecology;  Laterality: N/A;  ? STERILIZATION  2013  ? BTL  ? ? ?OB History   ? ? Gravida  ?5  ? Para  ?4  ? Term  ?4  ? Preterm  ?0  ? AB  ?1  ? Living  ?   ?  ? ? SAB  ?1  ? IAB  ?0  ? Ectopic  ?0  ? Multiple  ?   ? Live Births  ?   ?   ?  ? Obstetric Comments  ?All vaginal  ?  ? ?  ? ? ? ?Home Medications   ? ?Prior to Admission medications   ?Medication Sig Start Date End Date Taking? Authorizing Provider  ?mupirocin cream (BACTROBAN) 2 % Apply 1 application. topically 2 (two) times daily. 07/12/21  Yes Aundreya Souffrant, Britta Mccreedy, MD  ?nitrofurantoin, macrocrystal-monohydrate, (MACROBID) 100 MG capsule Take 1 capsule (100 mg total) by mouth 2 (two) times daily. 06/24/21   Chrzanowski, Clearnce Hasten B, NP  ?norethindrone-ethinyl estradiol-FE (JUNEL FE 1/20) 1-20  MG-MCG tablet Take 1 tablet by mouth daily. Start on the first day of your cycle after finishing the provera 11/17/20   Romualdo Bolk, MD  ?pantoprazole (PROTONIX) 20 MG tablet Take 20 mg by mouth daily.    [provider]  ? ? ?Family History ?Family History  ?Problem Relation Age of Onset  ? Asthma Mother   ? Hyperlipidemia Mother   ? Brain cancer Father   ? Alzheimer's disease Paternal Grandmother   ? ? ?Social History ?Social History  ? ?Tobacco Use  ? Smoking status: Former  ?  Years: 1.00  ?  Types: Cigarettes  ? Smokeless tobacco: Never  ? Tobacco comments:  ?  smoked for 1 year over 3 years ago  ?Vaping Use  ? Vaping Use: Never used  ?Substance Use Topics  ? Alcohol use: No  ? Drug use: No  ? ? ? ?Allergies   ?Patient has no known allergies. ? ? ?Review of Systems ?Review of Systems  ?Cardiovascular: Negative.   ?Gastrointestinal: Negative.   ?Skin:  Positive for wound. Negative for color change and pallor.  ? ? ?Physical Exam ?Triage Vital Signs ?ED Triage Vitals  ?Enc Vitals Group  ?  BP 07/12/21 1314 137/90  ?   Pulse --   ?   Resp 07/12/21 1312 18  ?   Temp 07/12/21 1312 98.3 ?F (36.8 ?C)  ?   Temp Source 07/12/21 1312 Oral  ?   SpO2 07/12/21 1312 100 %  ?   Weight --   ?   Height --   ?   Head Circumference --   ?   Peak Flow --   ?   Pain Score 07/12/21 1312 4  ?   Pain Loc --   ?   Pain Edu? --   ?   Excl. in GC? --   ? ?No data found. ? ?Updated Vital Signs ?BP 137/90 (BP Location: Left Arm)   Temp 98.3 ?F (36.8 ?C) (Oral)   Resp 16   LMP 04/18/2021 (Approximate)   SpO2 100%  ? ?Visual Acuity ?Right Eye Distance:   ?Left Eye Distance:   ?Bilateral Distance:   ? ?Right Eye Near:   ?Left Eye Near:    ?Bilateral Near:    ? ?Physical Exam ?Vitals and nursing note reviewed.  ?Cardiovascular:  ?   Rate and Rhythm: Normal rate and regular rhythm.  ?Pulmonary:  ?   Effort: Pulmonary effort is normal.  ?   Breath sounds: Normal breath sounds.  ?Musculoskeletal:     ?   General: Normal range  of motion.  ?Skin: ?   General: Skin is warm.  ?Neurological:  ?   General: No focal deficit present.  ?   Mental Status: She is alert.  ? ? ? ?UC Treatments / Results  ?Labs ?(all labs ordered are listed, but only abnormal results are displayed) ?Labs Reviewed - No data to display ? ?EKG ? ? ?Radiology ?No results found. ? ?Procedures ?Laceration Repair ? ?Date/Time: 07/12/2021 2:53 PM ?Performed by: Merrilee Jansky, MD ?Authorized by: Merrilee Jansky, MD  ? ?Consent:  ?  Consent obtained:  Verbal ?  Consent given by:  Patient ?  Risks discussed:  Infection and pain ?  Alternatives discussed:  No treatment ?Universal protocol:  ?  Patient identity confirmed:  Verbally with patient ?Anesthesia:  ?  Anesthesia method:  Local infiltration ?  Local anesthetic:  Lidocaine 1% w/o epi ?Laceration details:  ?  Location:  Finger ?  Finger location:  R index finger ?  Length (cm):  5 ?  Depth (mm):  5 ?Pre-procedure details:  ?  Preparation:  Patient was prepped and draped in usual sterile fashion ?Exploration:  ?  Imaging outcome: foreign body not noted   ?Treatment:  ?  Area cleansed with:  Povidone-iodine and Shur-Clens ?  Amount of cleaning:  Standard ?  Debridement:  Minimal ?Skin repair:  ?  Repair method:  Sutures ?  Suture size:  4-0 ?  Suture material:  Prolene ?  Suture technique:  Simple interrupted ?  Number of sutures:  5 ?Approximation:  ?  Approximation:  Close ?Repair type:  ?  Repair type:  Simple ?Post-procedure details:  ?  Dressing:  Antibiotic ointment and non-adherent dressing ?  Procedure completion:  Tolerated well, no immediate complications (including critical care time) ? ?Medications Ordered in UC ?Medications - No data to display ? ?Initial Impression / Assessment and Plan / UC Course  ?I have reviewed the triage vital signs and the nursing notes. ? ?Pertinent labs & imaging results that were available during my care of the patient were reviewed by me and considered in my  medical decision  making (see chart for details). ? ?  ? ?1.  Laceration of right index finger: ?Laceration repair completed ?Wound care recommendations given ?Tylenol/ibuprofen as needed for pain ?Return to urgent care in 7 to 10 days for suture removal ?Final Clinical Impressions(s) / UC Diagnoses  ? ?Final diagnoses:  ?Laceration of right index finger without foreign body without damage to nail, initial encounter  ? ? ? ?Discharge Instructions   ? ?  ?Please apply topical antibiotic ointment with daily wound dressing changes ?After 96 hours you may do nonocclusive dressing ?If you notice worsening pain, swelling, redness or discharge please return to urgent care to be reevaluated ?Return to urgent care in 7 to 10 days for suture removal. ? ? ?ED Prescriptions   ? ? Medication Sig Dispense Auth. Provider  ? mupirocin cream (BACTROBAN) 2 % Apply 1 application. topically 2 (two) times daily. 15 g Adrine Hayworth, Britta MccreedyPhilip O, MD  ? ?  ? ?PDMP not reviewed this encounter. ?  ?Merrilee JanskyLamptey, Rheya Minogue O, MD ?07/12/21 1456 ? ?  ?Merrilee JanskyLamptey, Aysa Larivee O, MD ?07/13/21 1619 ? ?

## 2021-09-03 DIAGNOSIS — K219 Gastro-esophageal reflux disease without esophagitis: Secondary | ICD-10-CM | POA: Insufficient documentation

## 2021-09-14 ENCOUNTER — Ambulatory Visit: Payer: Self-pay | Admitting: Obstetrics and Gynecology

## 2021-09-14 DIAGNOSIS — Z0289 Encounter for other administrative examinations: Secondary | ICD-10-CM

## 2021-11-09 NOTE — Progress Notes (Deleted)
41 y.o. G68P4010 Married White or Caucasian Hispanic or Latino female here for annual exam.      No LMP recorded.          Sexually active: {yes no:314532}  The current method of family planning is {contraception:315051}.    Exercising: {yes no:314532}  {types:19826} Smoker:  {YES NO:22349}  Health Maintenance: Pap:  12/07/18 WNL Hr HPV Neg  History of abnormal Pap:  no MMG:  n/a BMD:   n/a Colonoscopy: n/a TDaP:  up to date per patient  Gardasil: never    reports that she has quit smoking. Her smoking use included cigarettes. She has never used smokeless tobacco. She reports that she does not drink alcohol and does not use drugs.  Past Medical History:  Diagnosis Date   Abnormal uterine bleeding (AUB)    Anemia 02/08/2021   took iron in past   GERD (gastroesophageal reflux disease)    Wears glasses    for reading    Past Surgical History:  Procedure Laterality Date   DILATATION & CURETTAGE/HYSTEROSCOPY WITH MYOSURE N/A 02/16/2021   Procedure: DILATATION & CURETTAGE/HYSTEROSCOPY WITH MYOSURE;  Surgeon: Romualdo Bolk, MD;  Location: [facility];  Service: Gynecology;  Laterality: N/A;   STERILIZATION  2013   BTL    Current Outpatient Medications  Medication Sig Dispense Refill   mupirocin cream (BACTROBAN) 2 % Apply 1 application. topically 2 (two) times daily. 15 g 0   nitrofurantoin, macrocrystal-monohydrate, (MACROBID) 100 MG capsule Take 1 capsule (100 mg total) by mouth 2 (two) times daily. 14 capsule 0   norethindrone-ethinyl estradiol-FE (JUNEL FE 1/20) 1-20 MG-MCG tablet Take 1 tablet by mouth daily. Start on the first day of your cycle after finishing the provera 84 tablet 3   pantoprazole (PROTONIX) 20 MG tablet Take 20 mg by mouth daily.     No current facility-administered medications for this visit.    Family History  Problem Relation Age of Onset   Asthma Mother    Hyperlipidemia Mother    Brain cancer Father    Alzheimer's disease  Paternal Grandmother     Review of Systems  Exam:   There were no vitals taken for this visit.  Weight change: @WEIGHTCHANGE @ Height:      Ht Readings from Last 3 Encounters:  02/16/21 5\' 2"  (1.575 m)  01/21/21 5\' 2"  (1.575 m)  12/16/20 5\' 2"  (1.575 m)    General appearance: alert, cooperative and appears stated age Head: Normocephalic, without obvious abnormality, atraumatic Neck: no adenopathy, supple, symmetrical, trachea midline and thyroid {CHL AMB PHY EX THYROID NORM [phone]::"normal to inspection and palpation"} Lungs: clear to auscultation bilaterally Cardiovascular: regular rate and rhythm Breasts: {Exam; breast:13139::"normal appearance, no masses or tenderness"} Abdomen: soft, non-tender; non distended,  no masses,  no organomegaly Extremities: extremities normal, atraumatic, no cyanosis or edema Skin: Skin color, texture, turgor normal. No rashes or lesions Lymph nodes: Cervical, supraclavicular, and axillary nodes normal. No abnormal inguinal nodes palpated Neurologic: Grossly normal   Pelvic: External genitalia:  no lesions              Urethra:  normal appearing urethra with no masses, tenderness or lesions              Bartholins and Skenes: normal                 Vagina: normal appearing vagina with normal color and discharge, no lesions  Cervix: {CHL AMB PHY EX CERVIX NORM DEFAULT:[phone]::"no lesions"}               Bimanual Exam:  Uterus:  {CHL AMB PHY EX UTERUS NORM [phone]::"normal size, contour, position, consistency, mobility, non-tender"}              Adnexa: {CHL AMB PHY EX ADNEXA NO MASS DEFAULT:[phone]::"no mass, fullness, tenderness"}               Rectovaginal: Confirms               Anus:  normal sphincter tone, no lesions  *** chaperoned for the exam.  A:  Well Woman with normal exam  P:

## 2021-11-18 ENCOUNTER — Ambulatory Visit: Payer: Self-pay | Admitting: Obstetrics and Gynecology

## 2021-12-13 NOTE — Progress Notes (Signed)
41 y.o. G82P4010 Married White or Caucasian Hispanic or Latino female here for annual exam. She is on OCP's for cycle control. One day of her cycle is heavy where she changes her pad in an hour. This is better than prior to the pill.  Period Cycle (Days): 28 Period Duration (Days): 4 Period Pattern: Regular Menstrual Flow: Moderate Menstrual Control: Maxi pad Menstrual Control Change Freq (Hours): 1 Dysmenorrhea: None  She has had pain on her lower quadrant on both sides more so when she is sitting. This pain started in 11/22 or 12/22. She feels better when she is exercising. It hurts in her lower abdomen when she goes to get up. No pain if she is still. Tylenol helps the pain. The pain lasts a couple of seconds, dull.   No bowel or bladder c/o. No dyspareunia.   Patient's last menstrual period was 12/11/2021.          Sexually active: Yes.    The current method of family planning is BTL and OCP (estrogen/progesterone).    Exercising: Yes.     Walking Smoker:  no  Health Maintenance: Pap:   12/07/18 WNL Hr HPV Neg  History of abnormal Pap:  yes, not clear. MMG:  a few years ago BMD:   n/a Colonoscopy: n/a TDaP:  utd per patient Gardasil: never    reports that she has quit smoking. Her smoking use included cigarettes. She has never used smokeless tobacco. She reports that she does not drink alcohol and does not use drugs.  Past Medical History:  Diagnosis Date   Abnormal uterine bleeding (AUB)    Anemia 02/08/2021   took iron in past   GERD (gastroesophageal reflux disease)    Wears glasses    for reading    Past Surgical History:  Procedure Laterality Date   DILATATION & CURETTAGE/HYSTEROSCOPY WITH MYOSURE N/A 02/16/2021   Procedure: DILATATION & CURETTAGE/HYSTEROSCOPY WITH MYOSURE;  Surgeon: Romualdo Bolk, MD;  Location: [facility];  Service: Gynecology;  Laterality: N/A;   STERILIZATION  2013   BTL    Current Outpatient Medications  Medication  Sig Dispense Refill   norethindrone-ethinyl estradiol-FE (LARIN FE 1/20) 1-20 MG-MCG tablet Take 1 tablet by mouth daily. 28 tablet 0   pantoprazole (PROTONIX) 20 MG tablet Take 20 mg by mouth daily. (Patient not taking: Reported on 12/21/2021)     No current facility-administered medications for this visit.    Family History  Problem Relation Age of Onset   Asthma Mother    Hyperlipidemia Mother    Brain cancer Father    Alzheimer's disease Paternal Grandmother     Review of Systems  All other systems reviewed and are negative.   Exam:   BP 110/64   Pulse 78   Ht 5\' 2"  (1.575 m)   Wt 200 lb (90.7 kg)   LMP 12/11/2021   SpO2 100%   BMI 36.58 kg/m   Weight change: @WEIGHTCHANGE @ Height:   Height: 5\' 2"  (157.5 cm)  Ht Readings from Last 3 Encounters:  12/21/21 5\' 2"  (1.575 m)  02/16/21 5\' 2"  (1.575 m)  01/21/21 5\' 2"  (1.575 m)    General appearance: alert, cooperative and appears stated age Head: Normocephalic, without obvious abnormality, atraumatic Neck: no adenopathy, supple, symmetrical, trachea midline and thyroid normal to inspection and palpation Lungs: clear to auscultation bilaterally Cardiovascular: regular rate and rhythm Breasts: normal appearance, no masses or tenderness Abdomen: soft, mildly tender BLQ (very laterally and low), more tender with  palpation on tensed abdominal wall. Non distended,  no masses,  no organomegaly Extremities: extremities normal, atraumatic, no cyanosis or edema Skin: Skin color, texture, turgor normal. No rashes or lesions Lymph nodes: Cervical, supraclavicular, and axillary nodes normal. No abnormal inguinal nodes palpated Neurologic: Grossly normal   Pelvic: External genitalia:  no lesions              Urethra:  normal appearing urethra with no masses, tenderness or lesions              Bartholins and Skenes: normal                 Vagina: normal appearing vagina with normal color and discharge, no lesions              Cervix:  no lesions               Bimanual Exam:  Uterus:   no masses or tenderness              Adnexa: no mass, fullness, tenderness               Rectovaginal: Confirms               Anus:  normal sphincter tone, no lesions  Carolynn Serve chaperoned for the exam.  1. Well woman exam Discussed breast self exam Discussed calcium and vit D intake Mammogram after she turns 41  2. Lower abdominal pain Suspect MS pain, she will f/u with primary  3. Encounter for surveillance of contraceptive pills Doing well, still one day of heavy flow but better.  -She does have a h/o anemia, just had blood work with primary. She will get a copy of labs sent her - norethindrone-ethinyl estradiol-FE (LARIN FE 1/20) 1-20 MG-MCG tablet; Take 1 tablet by mouth daily.  Dispense: 84 tablet; Refill: 3  4. Screening for cervical cancer - Cytology - PAP

## 2021-12-15 ENCOUNTER — Other Ambulatory Visit: Payer: Self-pay | Admitting: Obstetrics and Gynecology

## 2021-12-15 DIAGNOSIS — N915 Oligomenorrhea, unspecified: Secondary | ICD-10-CM

## 2021-12-15 NOTE — Telephone Encounter (Signed)
Last annual exam 11/2020 Scheduled on 12/21/21

## 2021-12-21 ENCOUNTER — Other Ambulatory Visit (HOSPITAL_COMMUNITY)
Admission: RE | Admit: 2021-12-21 | Discharge: 2021-12-21 | Disposition: A | Discharge status: Home / Self Care | Payer: Self-pay | Source: Ambulatory Visit | Attending: Obstetrics and Gynecology | Admitting: Obstetrics and Gynecology

## 2021-12-21 ENCOUNTER — Ambulatory Visit: Payer: Self-pay | Admitting: Obstetrics and Gynecology

## 2021-12-21 ENCOUNTER — Encounter: Payer: Self-pay | Admitting: Obstetrics and Gynecology

## 2021-12-21 VITALS — BP 110/64 | HR 78 | Ht 62.0 in | Wt 200.0 lb

## 2021-12-21 DIAGNOSIS — Z124 Encounter for screening for malignant neoplasm of cervix: Secondary | ICD-10-CM | POA: Insufficient documentation

## 2021-12-21 DIAGNOSIS — Z01419 Encounter for gynecological examination (general) (routine) without abnormal findings: Secondary | ICD-10-CM

## 2021-12-21 DIAGNOSIS — R1030 Lower abdominal pain, unspecified: Secondary | ICD-10-CM

## 2021-12-21 DIAGNOSIS — Z3041 Encounter for surveillance of contraceptive pills: Secondary | ICD-10-CM

## 2021-12-21 MED ORDER — NORETHIN ACE-ETH ESTRAD-FE 1-20 MG-MCG PO TABS: 1.0000 | ORAL_TABLET | Freq: Every day | ORAL | 3 refills | Status: DC

## 2021-12-21 NOTE — Patient Instructions (Signed)

## 2021-12-23 ENCOUNTER — Encounter: Payer: Self-pay | Admitting: Obstetrics and Gynecology

## 2021-12-23 LAB — CYTOLOGY - PAP
Comment: NEGATIVE
Diagnosis: NEGATIVE
High risk HPV: NEGATIVE

## 2021-12-27 ENCOUNTER — Encounter: Payer: Self-pay | Admitting: Obstetrics and Gynecology

## 2021-12-30 ENCOUNTER — Ambulatory Visit: Payer: Self-pay | Admitting: Hematology and Oncology

## 2021-12-30 VITALS — BP 120/86 | Wt 198.3 lb

## 2021-12-30 DIAGNOSIS — N6310 Unspecified lump in the right breast, unspecified quadrant: Secondary | ICD-10-CM

## 2021-12-30 NOTE — Progress Notes (Signed)
Marissa [name] is a 41 y.o. female who presents to [facility] clinic today with no complaints . Screening mammogram was indeterminate; diagnostic mammogram revealed right breast mass. Scheduled for biopsy.    Pap Smear: Pap not smear completed today. Last Pap smear was 12/21/21 at [facility] [place] [facility] and was normal. Per patient has no history of an abnormal Pap smear. Last Pap smear result is available in Epic.   Physical exam: Breasts Breasts symmetrical. No skin abnormalities bilateral breasts. No nipple retraction bilateral breasts. No nipple discharge bilateral breasts. No lymphadenopathy. No lumps palpated bilateral breasts.         Pelvic/Bimanual Pap is not indicated today    Smoking History: Patient has is a former smoker who quit in 2005 and was not referred to quit line.    Patient Navigation: Patient education provided. Access to services provided for patient through [facility] program. Natale Lay interpreter provided. No transportation provided   Colorectal Cancer Screening: Per patient has never had colonoscopy completed No complaints today.    Breast and Cervical Cancer Risk Assessment: Patient does not have family history of breast cancer, known genetic mutations, or radiation treatment to the chest before age 43. Patient does not have history of cervical dysplasia, immunocompromised, or DES exposure in-utero.  Risk Assessment   No risk assessment data     A: [facility] exam without pap smear Benign exam. Ultrasound revealing right breast mass.  P: Referred patient to the [facility] for a  stereotactic biopsy . Appointment scheduled 01/03/22.  Pascal Lux, NP 12/30/2021 12:57 PM

## 2022-05-10 ENCOUNTER — Ambulatory Visit: Payer: Self-pay | Admitting: Obstetrics and Gynecology

## 2022-05-10 ENCOUNTER — Other Ambulatory Visit (HOSPITAL_COMMUNITY): Payer: Self-pay | Admitting: Gastroenterology

## 2022-05-10 DIAGNOSIS — R1011 Right upper quadrant pain: Secondary | ICD-10-CM

## 2022-05-17 ENCOUNTER — Emergency Department (HOSPITAL_COMMUNITY)
Admission: EM | Admit: 2022-05-17 | Discharge: 2022-05-18 | Disposition: A | Discharge status: Home / Self Care | Payer: Self-pay | Attending: Emergency Medicine | Admitting: Emergency Medicine

## 2022-05-17 ENCOUNTER — Other Ambulatory Visit: Payer: Self-pay

## 2022-05-17 DIAGNOSIS — R1032 Left lower quadrant pain: Secondary | ICD-10-CM | POA: Insufficient documentation

## 2022-05-17 DIAGNOSIS — R102 Pelvic and perineal pain: Secondary | ICD-10-CM | POA: Insufficient documentation

## 2022-05-17 DIAGNOSIS — R1031 Right lower quadrant pain: Secondary | ICD-10-CM | POA: Insufficient documentation

## 2022-05-17 LAB — URINALYSIS, ROUTINE W REFLEX MICROSCOPIC
Bacteria, UA: NONE SEEN
Bilirubin Urine: NEGATIVE
Glucose, UA: NEGATIVE mg/dL
Ketones, ur: NEGATIVE mg/dL
Leukocytes,Ua: NEGATIVE
Nitrite: NEGATIVE
Protein, ur: NEGATIVE mg/dL
Specific Gravity, Urine: 1.004 — ABNORMAL LOW (ref 1.005–1.030)
pH: 6 (ref 5.0–8.0)

## 2022-05-17 LAB — CBC WITH DIFFERENTIAL/PLATELET
Abs Immature Granulocytes: 0.01 10*3/uL (ref 0.00–0.07)
Basophils Absolute: 0 10*3/uL (ref 0.0–0.1)
Basophils Relative: 0 %
Eosinophils Absolute: 0.2 10*3/uL (ref 0.0–0.5)
Eosinophils Relative: 2 %
HCT: 38.7 % (ref 36.0–46.0)
Hemoglobin: 12.8 g/dL (ref 12.0–15.0)
Immature Granulocytes: 0 %
Lymphocytes Relative: 20 %
Lymphs Abs: 1.4 10*3/uL (ref 0.7–4.0)
MCH: 26.2 pg (ref 26.0–34.0)
MCHC: 33.1 g/dL (ref 30.0–36.0)
MCV: 79.1 fL — ABNORMAL LOW (ref 80.0–100.0)
Monocytes Absolute: 0.4 10*3/uL (ref 0.1–1.0)
Monocytes Relative: 6 %
Neutro Abs: 5.1 10*3/uL (ref 1.7–7.7)
Neutrophils Relative %: 72 %
Platelets: 306 10*3/uL (ref 150–400)
RBC: 4.89 MIL/uL (ref 3.87–5.11)
RDW: 15.8 % — ABNORMAL HIGH (ref 11.5–15.5)
WBC: 7.1 10*3/uL (ref 4.0–10.5)
nRBC: 0 % (ref 0.0–0.2)

## 2022-05-17 LAB — COMPREHENSIVE METABOLIC PANEL
ALT: 18 U/L (ref 0–44)
AST: 23 U/L (ref 15–41)
Albumin: 3.9 g/dL (ref 3.5–5.0)
Alkaline Phosphatase: 61 U/L (ref 38–126)
Anion gap: 9 (ref 5–15)
BUN: 7 mg/dL (ref 6–20)
CO2: 25 mmol/L (ref 22–32)
Calcium: 9 mg/dL (ref 8.9–10.3)
Chloride: 103 mmol/L (ref 98–111)
Creatinine, Ser: 0.66 mg/dL (ref 0.44–1.00)
GFR, Estimated: >60 mL/min (ref >60)
Glucose, Bld: 103 mg/dL — ABNORMAL HIGH (ref 70–99)
Potassium: 3.8 mmol/L (ref 3.5–5.1)
Sodium: 137 mmol/L (ref 135–145)
Total Bilirubin: 0.3 mg/dL (ref 0.3–1.2)
Total Protein: 7.4 g/dL (ref 6.5–8.1)

## 2022-05-17 LAB — LIPASE, BLOOD: Lipase: 31 U/L (ref 11–51)

## 2022-05-17 MED ORDER — ONDANSETRON HCL 4 MG/2ML IJ SOLN
4.0000 mg | Freq: Once | INTRAMUSCULAR | Status: AC
  Administered 2022-05-18: 4 mg via INTRAVENOUS
  Filled 2022-05-17: qty 2

## 2022-05-17 MED ORDER — SODIUM CHLORIDE 0.9 % IV BOLUS
1000.0000 mL | Freq: Once | INTRAVENOUS | Status: AC
  Administered 2022-05-18: 1000 mL via INTRAVENOUS

## 2022-05-17 MED ORDER — MORPHINE SULFATE (PF) 4 MG/ML IV SOLN
4.0000 mg | Freq: Once | INTRAVENOUS | Status: AC
  Administered 2022-05-18: 4 mg via INTRAVENOUS
  Filled 2022-05-17: qty 1

## 2022-05-17 NOTE — ED Triage Notes (Signed)
Patient reports pain across lower abdomen onset this week radiating to both hips , no emesis or diarrhea . Mild nausea.

## 2022-05-17 NOTE — ED Provider Triage Note (Signed)
Emergency Medicine Provider Triage Evaluation Note  Marissa [name] , a 42 y.o. female  was evaluated in triage.  Pt complains of bilateral lower abdominal pain that radiates around to her back for the last 3 days.  Patient states that at first pain was intermittent but it has become constant and is worse when she goes to urinate.  Patient denies any explicit burning with urination but does feel that she is emptying her bladder, however, notes that when she sits down she notes increased pain and pressure to her lower abdomen.  Pain is associated with nausea but no fever, chills, vomiting, diarrhea, hematuria, vaginal discharge, or other complaints today.  She states that she started her menstrual cycle as well 3 days ago but typically does not have any pain or issues when on her cycle.  Of note, patient has been seeing a GI doctor for some recurrent pain more so in her right upper abdomen and was referred to have a CT abdomen pelvis with contrast today which on review through care everywhere shows no acute abnormalities.  She states that she did not have blood work or urine test at that time and I do not see these results from recent visits either so we will proceed with abdominal pain labs for further evaluation.  She denies any previous abdominal surgeries.  Review of Systems  Positive: See HPI I Negative: See HPI  Physical Exam  BP (!) 141/86   Pulse 66   Temp 98.8 F (37.1 C) (Oral)   Resp 16   LMP 05/14/2022   SpO2 100%  Gen:   Awake, no distress   Resp:  Normal effort  MSK:   Moves extremities without difficulty  Other:  Mild diffuse lower abdominal tenderness, no CVA tenderness, no rebound or guarding, negative Murphy sign, negative McBurney's tenderness, no other signs of acute abdomen, abdomen is soft and does not appear distended  Medical Decision Making  Medically screening exam initiated at 8:47 PM.  Appropriate orders placed.  Marissa [name] was informed  that the remainder of the evaluation will be completed by another provider, this initial triage assessment does not replace that evaluation, and the importance of remaining in the ED until their evaluation is complete.     Turner Daniels 05/17/22 2049

## 2022-05-18 ENCOUNTER — Emergency Department (HOSPITAL_COMMUNITY): Payer: Self-pay

## 2022-05-18 LAB — PREGNANCY, URINE: Preg Test, Ur: NEGATIVE

## 2022-05-18 LAB — GC/CHLAMYDIA PROBE AMP (~~LOC~~) NOT AT ARMC
Chlamydia: NEGATIVE
Comment: NEGATIVE
Comment: NORMAL
Neisseria Gonorrhea: NEGATIVE

## 2022-05-18 MED ORDER — MORPHINE SULFATE (PF) 4 MG/ML IV SOLN
4.0000 mg | Freq: Once | INTRAVENOUS | Status: DC
Start: 2022-05-18 — End: 2022-05-18
  Filled 2022-05-18: qty 1

## 2022-05-18 NOTE — ED Notes (Signed)
RN requested urine from patient and was informed she did not have to at that time.

## 2022-05-18 NOTE — ED Notes (Signed)
RN called to request add on pregnancy test. Lab informed cannot add at this time. RN will try to recollect later.

## 2022-05-18 NOTE — ED Provider Notes (Signed)
[facility] Provider Note   CSN: [id number] Arrival date & time: 05/17/22  2018     History  Chief Complaint  Patient presents with   Abdominal Pain    Marissa [name] is a 42 y.o. female.  Patient presents to the emergency department for evaluation of abdominal pain.  Patient reports that she has been having pain in the low abdomen and pelvis area for 3 days.  Pain radiates around both sides and into her back.  Initially the pain would come and go, now is constant and severe.  She has not had urinary frequency or dysuria.  No associated nausea, vomiting, diarrhea or constipation.  Patient reports that she had a CT scan ordered by her GI doctor yesterday.  It did not show any abnormal findings, she continues to have pain.  Patient reports that she started having this pain intermittently after she had a polyp removed from her uterus.  The pain would only happen occasionally and would normally go away fairly quickly.  Pain today is unusual.       Home Medications Prior to Admission medications   Medication Sig Start Date End Date Taking? Authorizing Provider  norethindrone-ethinyl estradiol-FE Ronnette Juniper FE 1/20) 1-20 MG-MCG tablet Take 1 tablet by mouth daily. 12/21/21   Salvadore Dom, MD  pantoprazole (PROTONIX) 20 MG tablet Take 20 mg by mouth daily. Patient not taking: Reported on 12/21/2021    [provider]      Allergies    Patient has no known allergies.    Review of Systems   Review of Systems  Physical Exam Updated Vital Signs BP 121/79   Pulse 70   Temp 98.3 F (36.8 C) (Oral)   Resp (!) 21   LMP 05/14/2022   SpO2 96%  Physical Exam Vitals and nursing note reviewed.  Constitutional:      General: She is not in acute distress.    Appearance: She is well-developed.  HENT:     Head: Normocephalic and atraumatic.     Mouth/Throat:     Mouth: Mucous membranes are moist.  Eyes:     General:  Vision grossly intact. Gaze aligned appropriately.     Extraocular Movements: Extraocular movements intact.     Conjunctiva/sclera: Conjunctivae normal.  Cardiovascular:     Rate and Rhythm: Normal rate and regular rhythm.     Pulses: Normal pulses.     Heart sounds: Normal heart sounds, S1 normal and S2 normal. No murmur heard.    No friction rub. No gallop.  Pulmonary:     Effort: Pulmonary effort is normal. No respiratory distress.     Breath sounds: Normal breath sounds.  Abdominal:     General: Bowel sounds are normal.     Palpations: Abdomen is soft.     Tenderness: There is abdominal tenderness in the right lower quadrant, suprapubic area and left lower quadrant. There is no guarding or rebound.     Hernia: No hernia is present.  Genitourinary:    Vagina: Normal.     Cervix: No cervical motion tenderness.     Uterus: Normal.      Adnexa:        Right: Tenderness present. No mass.         Left: No mass.    Musculoskeletal:        General: No swelling.     Cervical back: Full passive range of motion without pain, normal range of motion  and neck supple. No spinous process tenderness or muscular tenderness. Normal range of motion.     Right lower leg: No edema.     Left lower leg: No edema.  Skin:    General: Skin is warm and dry.     Capillary Refill: Capillary refill takes less than 2 seconds.     Findings: No ecchymosis, erythema, rash or wound.  Neurological:     General: No focal deficit present.     Mental Status: She is alert and oriented to person, place, and time.     GCS: GCS eye subscore is 4. GCS verbal subscore is 5. GCS motor subscore is 6.     Cranial Nerves: Cranial nerves 2-12 are intact.     Sensory: Sensation is intact.     Motor: Motor function is intact.     Coordination: Coordination is intact.  Psychiatric:        Attention and Perception: Attention normal.        Mood and Affect: Mood normal.        Speech: Speech normal.        Behavior:  Behavior normal.     ED Results / Procedures / Treatments   Labs (all labs ordered are listed, but only abnormal results are displayed) Labs Reviewed  CBC WITH DIFFERENTIAL/PLATELET - Abnormal; Notable for the following components:      Result Value   MCV 79.1 (*)    RDW 15.8 (*)    All other components within normal limits  COMPREHENSIVE METABOLIC PANEL - Abnormal; Notable for the following components:   Glucose, Bld 103 (*)    All other components within normal limits  URINALYSIS, ROUTINE W REFLEX MICROSCOPIC - Abnormal; Notable for the following components:   Color, Urine COLORLESS (*)    Specific Gravity, Urine 1.004 (*)    Hgb urine dipstick SMALL (*)    All other components within normal limits  LIPASE, BLOOD  PREGNANCY, URINE    EKG None  Radiology US PELVIC COMPLETE W TRANSVAGINAL AND TORSION R/O  Result Date: 05/18/2022 CLINICAL DATA:  Pelvic pain EXAM: TRANSABDOMINAL AND TRANSVAGINAL ULTRASOUND OF PELVIS DOPPLER ULTRASOUND OF OVARIES TECHNIQUE: Both transabdominal and transvaginal ultrasound examinations of the pelvis were performed. Transabdominal technique was performed for global imaging of the pelvis including uterus, ovaries, adnexal regions, and pelvic cul-de-sac. It was necessary to proceed with endovaginal exam following the transabdominal exam to visualize the right ovary. Color and duplex Doppler ultrasound was utilized to evaluate blood flow to the ovaries. COMPARISON:  Ultrasound 12/08/2020 FINDINGS: Uterus Measurements: 10.7 x 4.8 x 6.1 cm = volume: 161 mL. No fibroids or other mass visualized. Endometrium Thickness: 14 mm.  No focal abnormality visualized. Right ovary Measurements: 2.9 x 1.9 x 2.3 cm = volume: 6.6 mL. Normal appearance/no adnexal mass. Left ovary Measurements: 3.0 x 1.2 x 2.3 cm = volume: 4.2 mL. Normal appearance/no adnexal mass. Pulsed Doppler evaluation of both ovaries demonstrates normal low-resistance arterial and venous waveforms. Other  findings No abnormal free fluid. IMPRESSION: No sonographic correlate for pelvic pain. Electronically Signed   By: Placido Sou M.D.   On: 05/18/2022 01:21    Procedures Procedures    Medications Ordered in ED Medications  morphine (PF) 4 MG/ML injection 4 mg (has no administration in time range)  sodium chloride 0.9 % bolus 1,000 mL (0 mLs Intravenous Stopped 05/18/22 0242)  morphine (PF) 4 MG/ML injection 4 mg (4 mg Intravenous Given 05/18/22 0013)  ondansetron (ZOFRAN) injection 4  mg (4 mg Intravenous Given 05/18/22 0013)    ED Course/ Medical Decision Making/ A&P                             Medical Decision Making Amount and/or Complexity of Data Reviewed External Data Reviewed: radiology. Labs: ordered. Decision-making details documented in ED Course. Radiology: ordered and independent interpretation performed. Decision-making details documented in ED Course.  Risk Prescription drug management.   Presents for evaluation of abdominal pain.  Abdominal exam reveals diffuse tenderness in the lower abdomen and pelvic area without guarding, rebound or signs peritonitis.  I did access the patient's outpatient CT scan and there were no acute findings.  Patient underwent pelvic ultrasound with Doppler, no acute findings.  Pelvic exam fairly benign, no signs of PID.  She does have some tenderness in the area of the right adnexa without mass.  I suspect that the cause of her pain is gynecologic in origin.  Patient reports that she has follow-up with her gynecologist later today, will be discharged after additional analgesia to perform the follow-up.        Final Clinical Impression(s) / ED Diagnoses Final diagnoses:  Pelvic pain in female    Rx / DC Orders ED Discharge Orders     None         Rieley Hausman, Gwenyth Allegra, MD 05/18/22 [phone]

## 2022-05-18 NOTE — ED Notes (Signed)
Pt refused morphine. States she is feeling better and does not want to take anymore pain medication. Pt rates pain manageable at 5/10.

## 2022-05-18 NOTE — ED Notes (Signed)
Discharge instructions provided by EDP, including OBGYN follow up, pending labs, and access to MyChart, were discussed with pt. Pt was able to verbalize understanding with no additional questions at this time. Pt to go home with s/o at bedside

## 2022-05-19 ENCOUNTER — Ambulatory Visit (INDEPENDENT_AMBULATORY_CARE_PROVIDER_SITE_OTHER): Payer: Self-pay | Admitting: Obstetrics and Gynecology

## 2022-05-19 ENCOUNTER — Encounter: Payer: Self-pay | Admitting: Obstetrics and Gynecology

## 2022-05-19 VITALS — BP 120/62 | Wt 200.6 lb

## 2022-05-19 DIAGNOSIS — N912 Amenorrhea, unspecified: Secondary | ICD-10-CM

## 2022-05-19 DIAGNOSIS — R102 Pelvic and perineal pain: Secondary | ICD-10-CM

## 2022-05-19 DIAGNOSIS — M7918 Myalgia, other site: Secondary | ICD-10-CM

## 2022-05-19 MED ORDER — CYCLOBENZAPRINE HCL 5 MG PO TABS: 5.0000 mg | ORAL_TABLET | Freq: Three times a day (TID) | ORAL | 0 refills | Status: DC | PRN

## 2022-05-19 MED ORDER — IBUPROFEN 800 MG PO TABS: 800.0000 mg | ORAL_TABLET | Freq: Three times a day (TID) | ORAL | 1 refills | Status: DC | PRN

## 2022-05-19 NOTE — Progress Notes (Signed)
GYNECOLOGY  VISIT   HPI: 42 y.o.   Married White or Caucasian Hispanic or Latino  female   [phone] with Patient's last menstrual period was 05/14/2022.   here for no menses.  She skipped her period for about 2 months then she had it on Jan 27 and it lasted for two days and was lights.   She was seen in the ER yesterday for pelvic pain. Negative Gc/CT, negative UPT, normal CBC. No signs of PID on exam.   Pelvic ultrasound 05/18/22 Normal sized uterus, endometrial stripe 14 mm, normal ovaries.  She is still having pain, but better than it was. The levsin seems to help.   The pain started 4 days ago, is in the RLQ and in her right lower back. It hurts all the time, worse when she bends down. No pain with BM. BM every day, She hasn't had a bm for 2 days.   2 months ago she had this pain, only lasted a day.   She is still taking OCP's, she stopped the pills a week ago she hadn't had a cycle.   GYNECOLOGIC HISTORY: Patient's last menstrual period was 05/14/2022. 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   Abdominal pain 07/13/2017   Gastroesophageal reflux disease without esophagitis 07/13/2017   Abnormal cervical Papanicolaou smear 12/22/2015   Abnormal uterine bleeding (AUB) 12/18/2015   Constipation 12/02/2015   Weight gain 09/26/2013   Amenorrhea 09/26/2013   Abdominal pain, left lower quadrant 09/26/2013    Past Medical History:  Diagnosis Date   Abnormal uterine bleeding (AUB)    Anemia 02/08/2021   took iron in past   GERD (gastroesophageal reflux disease)    Wears glasses    for reading    Past Surgical History:  Procedure Laterality  Date   DILATATION & CURETTAGE/HYSTEROSCOPY WITH MYOSURE N/A 02/16/2021   Procedure: DILATATION & CURETTAGE/HYSTEROSCOPY WITH MYOSURE;  Surgeon: Salvadore Dom, MD;  Location: [facility];  Service: Gynecology;  Laterality: N/A;   STERILIZATION  2013   BTL    Current Outpatient Medications  Medication Sig Dispense Refill   hyoscyamine (LEVSIN SL) 0.125 MG SL tablet Place under the tongue 2 (two) times daily as needed.     norethindrone-ethinyl estradiol-FE (LARIN FE 1/20) 1-20 MG-MCG tablet Take 1 tablet by mouth daily. 84 tablet 3   No current facility-administered medications for this visit.     ALLERGIES: Patient has no known allergies.  Family History  Problem Relation Age of Onset   Asthma Mother    Hyperlipidemia Mother    Brain cancer Father    Alzheimer's disease Paternal Grandmother     Social History   Socioeconomic History   Marital status: Married    Spouse name: Not on file   Number of children: Not on file   Years of education: Not on file  Highest education level: Not on file  Occupational History   Not on file  Tobacco Use   Smoking status: Former    Years: 1.00    Types: Cigarettes    Quit date: 2005    Years since quitting: 19.0   Smokeless tobacco: Never   Tobacco comments:    smoked for 1 year over 3 years ago  Vaping Use   Vaping Use: Never used  Substance and Sexual Activity   Alcohol use: No   Drug use: No   Sexual activity: Yes    Partners: Male    Birth control/protection: Surgical    Comment: tubal  Other Topics Concern   Not on file  Social History Narrative   ** Merged History Encounter **       Social Determinants of Health   Financial Resource Strain: Not on file  Food Insecurity: No Food Insecurity (12/30/2021)   Hunger Vital Sign    Worried About Running Out of Food in the Last Year: Never true    Ran Out of Food in the Last Year: Never true  Transportation Needs: No Transportation Needs (12/30/2021)    PRAPARE - Hydrologist (Medical): No    Lack of Transportation (Non-Medical): No  Physical Activity: Not on file  Stress: Not on file  Social Connections: Not on file  Intimate Partner Violence: Not on file    Review of Systems  All other systems reviewed and are negative.   PHYSICAL EXAMINATION:    BP 120/62   Wt 200 lb 9.6 oz (91 kg)   LMP 05/14/2022   BMI 36.69 kg/m     General appearance: alert, cooperative and appears stated age Abdomen: soft, mildly tender in the RLQ, more tender with palpation of a tensed abdominal wall; non distended, no masses,  no organomegaly  Pelvic: External genitalia:  no lesions              Urethra:  normal appearing urethra with no masses, tenderness or lesions              Bartholins and Skenes: normal                 Vagina: normal appearing vagina with normal color and discharge, no lesions              Cervix: no cervical motion tenderness and no lesions              Bimanual Exam:  Uterus:  normal size, contour, position, consistency, mobility, non-tender and anteverted              Adnexa:  no masses, mildly tender on the right              Pelvic floor: mildly tender on the right  Chaperone was present for exam.  1. Pelvic pain Normal u/s, normal WBC, negative UPT, negative cervical cultures. On exam she is most tender with palpation of her abdominal wall and right pelvic floor.    2. Musculoskeletal pain - ibuprofen (ADVIL) 800 MG tablet; Take 1 tablet (800 mg total) by mouth every 8 (eight) hours as needed.  Dispense: 30 tablet; Refill: 1 - cyclobenzaprine (FLEXERIL) 5 MG tablet; Take 1 tablet (5 mg total) by mouth 3 (three) times daily as needed for muscle spasms.  Dispense: 10 tablet; Refill: 0  3. Amenorrhea Discussed that not cycling on the pill is okay. She will restart her pills (just off for a week).

## 2022-05-23 ENCOUNTER — Encounter (HOSPITAL_COMMUNITY): Payer: Self-pay

## 2022-06-07 ENCOUNTER — Encounter (HOSPITAL_COMMUNITY): Payer: Self-pay

## 2022-06-07 ENCOUNTER — Ambulatory Visit (HOSPITAL_COMMUNITY)
Admission: RE | Admit: 2022-06-07 | Discharge: 2022-06-07 | Disposition: A | Discharge status: Home / Self Care | Payer: Self-pay | Source: Ambulatory Visit | Attending: Gastroenterology | Admitting: Gastroenterology

## 2022-06-07 DIAGNOSIS — R1011 Right upper quadrant pain: Secondary | ICD-10-CM | POA: Insufficient documentation

## 2022-07-06 ENCOUNTER — Telehealth: Payer: Self-pay | Admitting: Hematology and Oncology

## 2022-07-15 ENCOUNTER — Ambulatory Visit
Admission: RE | Admit: 2022-07-15 | Discharge: 2022-07-15 | Disposition: A | Discharge status: Home / Self Care | Payer: No Typology Code available for payment source | Source: Ambulatory Visit | Attending: Internal Medicine | Admitting: Internal Medicine

## 2022-07-15 ENCOUNTER — Other Ambulatory Visit: Payer: Self-pay | Admitting: Internal Medicine

## 2022-07-15 DIAGNOSIS — R102 Pelvic and perineal pain: Secondary | ICD-10-CM

## 2022-10-06 ENCOUNTER — Encounter: Payer: Self-pay | Admitting: Obstetrics and Gynecology

## 2022-10-06 ENCOUNTER — Ambulatory Visit (INDEPENDENT_AMBULATORY_CARE_PROVIDER_SITE_OTHER): Payer: Self-pay | Admitting: Obstetrics and Gynecology

## 2022-10-06 ENCOUNTER — Ambulatory Visit (INDEPENDENT_AMBULATORY_CARE_PROVIDER_SITE_OTHER): Payer: Self-pay

## 2022-10-06 ENCOUNTER — Other Ambulatory Visit (HOSPITAL_COMMUNITY)
Admission: RE | Admit: 2022-10-06 | Discharge: 2022-10-06 | Disposition: A | Discharge status: Home / Self Care | Payer: Self-pay | Source: Ambulatory Visit | Attending: Obstetrics and Gynecology | Admitting: Obstetrics and Gynecology

## 2022-10-06 VITALS — BP 120/62 | Wt 196.0 lb

## 2022-10-06 DIAGNOSIS — N939 Abnormal uterine and vaginal bleeding, unspecified: Secondary | ICD-10-CM

## 2022-10-06 LAB — CBC
HCT: 37 % (ref 35.0–45.0)
Hemoglobin: 12.2 g/dL (ref 11.7–15.5)
MCH: 29 pg (ref 27.0–33.0)
MCHC: 33 g/dL (ref 32.0–36.0)
MCV: 87.9 fL (ref 80.0–100.0)
MPV: 9.2 fL (ref 7.5–12.5)
Platelets: 320 10*3/uL (ref 140–400)
RBC: 4.21 10*6/uL (ref 3.80–5.10)
RDW: 13.8 % (ref 11.0–15.0)
WBC: 5.8 10*3/uL (ref 3.8–10.8)

## 2022-10-06 LAB — PREGNANCY, URINE: Preg Test, Ur: NEGATIVE

## 2022-10-06 MED ORDER — NORETHINDRONE ACETATE 5 MG PO TABS
ORAL_TABLET | ORAL | 1 refills | Status: DC
Start: 2022-10-06 — End: 2023-05-29

## 2022-10-06 NOTE — Patient Instructions (Signed)

## 2022-10-06 NOTE — Progress Notes (Signed)
GYNECOLOGY  VISIT   HPI: 42 y.o.   Married White or Caucasian Hispanic or Latino  female   [phone] with No LMP recorded.   here for  bleeding that started about 4 days ago. She states that her bleeding is heavy. She is soaking a maxi pad every hour to hour and a half.   She was seen in 2/24 c/o pelvic pain, but also c/o no cycles on her OCP's. She skipped 2 cycles then had a light cycle for 2 days at the end of January. She stopped her pills but then restarted them.   Pelvic ultrasound 05/18/22 Normal sized uterus, endometrial stripe 14 mm, normal ovaries.  She has been having regular cycles since then on OCP's. She is currently in the 3rd week of the pill. No late or missed pills.   In the last few months she has been bleeding monthly x 2-3 days, typically goes through a pad 3 x a day. This is much heavier than normal. Passing clots. No cramps.   Sexually active, no pain.   She is very tired, no change in hair loss. Not dizzy.   GYNECOLOGIC HISTORY: No LMP recorded. 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   Abdominal pain 07/13/2017   Gastroesophageal reflux disease without esophagitis 07/13/2017   Abnormal cervical Papanicolaou smear 12/22/2015   Abnormal uterine bleeding (AUB) 12/18/2015   Constipation 12/02/2015   Weight gain 09/26/2013   Amenorrhea 09/26/2013   Abdominal pain, left lower quadrant 09/26/2013    Past Medical History:  Diagnosis Date   Abnormal uterine bleeding (AUB)    Anemia 02/08/2021   took iron in past   GERD (gastroesophageal reflux disease)    Wears glasses    for reading    Past Surgical History:  Procedure  Laterality Date   DILATATION & CURETTAGE/HYSTEROSCOPY WITH MYOSURE N/A 02/16/2021   Procedure: DILATATION & CURETTAGE/HYSTEROSCOPY WITH MYOSURE;  Surgeon: Romualdo Bolk, MD;  Location: [facility];  Service: Gynecology;  Laterality: N/A;   STERILIZATION  2013   BTL    Current Outpatient Medications  Medication Sig Dispense Refill   cyclobenzaprine (FLEXERIL) 5 MG tablet Take 1 tablet (5 mg total) by mouth 3 (three) times daily as needed for muscle spasms. 10 tablet 0   hyoscyamine (LEVSIN SL) 0.125 MG SL tablet Place under the tongue 2 (two) times daily as needed.     ibuprofen (ADVIL) 800 MG tablet Take 1 tablet (800 mg total) by mouth every 8 (eight) hours as needed. 30 tablet 1   norethindrone-ethinyl estradiol-FE (LARIN FE 1/20) 1-20 MG-MCG tablet Take 1 tablet by mouth daily. 84 tablet 3   No current facility-administered medications for this visit.     ALLERGIES: Patient has no known allergies.  Family History  Problem Relation Age of Onset   Asthma Mother    Hyperlipidemia Mother  Brain cancer Father    Alzheimer's disease Paternal Grandmother     Social History   Socioeconomic History   Marital status: Married    Spouse name: Not on file   Number of children: Not on file   Years of education: Not on file   Highest education level: Not on file  Occupational History   Not on file  Tobacco Use   Smoking status: Former    Years: 1    Types: Cigarettes    Quit date: 2005    Years since quitting: 19.4   Smokeless tobacco: Never   Tobacco comments:    smoked for 1 year over 3 years ago  Vaping Use   Vaping Use: Never used  Substance and Sexual Activity   Alcohol use: No   Drug use: No   Sexual activity: Yes    Partners: Male    Birth control/protection: Surgical    Comment: tubal  Other Topics Concern   Not on file  Social History Narrative   ** Merged History Encounter **       Social Determinants of Health   Financial Resource  Strain: Not on file  Food Insecurity: No Food Insecurity (12/30/2021)   Hunger Vital Sign    Worried About Running Out of Food in the Last Year: Never true    Ran Out of Food in the Last Year: Never true  Transportation Needs: No Transportation Needs (12/30/2021)   PRAPARE - Administrator, Civil Service (Medical): No    Lack of Transportation (Non-Medical): No  Physical Activity: Not on file  Stress: Not on file  Social Connections: Not on file  Intimate Partner Violence: Not on file    Review of Systems  All other systems reviewed and are negative.   PHYSICAL EXAMINATION:    BP 120/62   Wt 196 lb (88.9 kg)   BMI 35.85 kg/m     General appearance: alert, cooperative and appears stated age Neck: no adenopathy, supple, symmetrical, trachea midline and thyroid normal to inspection and palpation Abdomen: soft, non-tender; non distended, no masses,  no organomegaly  Pelvic: External genitalia:  no lesions              Urethra:  normal appearing urethra with no masses, tenderness or lesions              Bartholins and Skenes: normal                 Vagina: normal appearing vagina with normal color and discharge, no lesions, moderate to large amount of blood/clot in her vagina.               Cervix: no cervical motion tenderness and no lesions, constant moderate flow coming from the cervix              Bimanual Exam:  Uterus:   bulky, anteverted uterus, mildly tender              Adnexa: no mass, fullness, tenderness               Chaperone was present for exam.  1. Abnormal uterine bleeding (AUB) On OCP's for cycle control which has been working until now. No late or missed cycles.  - TSH - CBC - Ferritin - Pregnancy, urine - US PELVIS TRANSVAGINAL NON-OB (TV ONLY); Future - norethindrone (AYGESTIN) 5 MG tablet; Take one tablet BID, if bleeding hasn't slowed down by tomorrow then increase to TID. Once bleeding  stops go to one tablet a day  Dispense: 30 tablet;  Refill: 1 -Given heavy bleeding on OCP's, will stop OCP;s for now and start the aygestin as above.   Addendum: Pelvic ultrasound  Indications: AUB  Findings:  Uterus 9.84 x 6.24 x 5.73, anteverted  Endometrium 8.81 cm, dilated with fluid and debris, no feeder vessels identified  Left ovary 3.17 x 1.64 x 0.79 cm  Right ovary 2.74 x 2.0 x 1.36 cm  No free fluid  Impression:  Normal sized anteverted uterus, no myometrial masses Endometrial cavity filled with fluid/debris Post endometrial evacuation stripe 4.0 mm Normal ovaries bilaterally with sparse follicles.   The risks of endometrial biopsy were reviewed and a consent was obtained.  A speculum was placed in the vagina and the cervix was cleansed with betadine. A tenaculum was placed on the cervix and the pipelle was placed into the endometrial cavity. The uterus sounded to ~9 cm. The endometrial biopsy was performed, taking care to get a representative sample, sampling 360 degrees of the uterine cavity. A large amount of clot and tissue was obtained, the syringe was filled 1.5 times. The tenaculum and speculum were removed. There were no complications.   Post biopsy u/s revealed a thin uniform endometrial stripe of 4.0 mm  Plan: Labs as above Given marked decrease in bleeding post biopsy she will start with 1 aygestin a day

## 2022-10-07 LAB — SURGICAL PATHOLOGY

## 2022-10-12 LAB — FERRITIN: Ferritin: 5 ng/mL — ABNORMAL LOW (ref 16–154)

## 2022-10-12 LAB — T4, FREE: Free T4: 1 ng/dL (ref 0.8–1.8)

## 2022-10-12 LAB — TSH: TSH: 4.61 mIU/L — ABNORMAL HIGH

## 2022-11-21 ENCOUNTER — Ambulatory Visit (HOSPITAL_COMMUNITY)
Admission: EM | Admit: 2022-11-21 | Discharge: 2022-11-21 | Disposition: A | Discharge status: Home / Self Care | Payer: Self-pay | Attending: Internal Medicine | Admitting: Internal Medicine

## 2022-11-21 ENCOUNTER — Encounter (HOSPITAL_COMMUNITY): Payer: Self-pay | Admitting: Emergency Medicine

## 2022-11-21 ENCOUNTER — Other Ambulatory Visit: Payer: Self-pay

## 2022-11-21 DIAGNOSIS — L509 Urticaria, unspecified: Secondary | ICD-10-CM

## 2022-11-21 DIAGNOSIS — L2389 Allergic contact dermatitis due to other agents: Secondary | ICD-10-CM

## 2022-11-21 MED ORDER — TRIAMCINOLONE ACETONIDE 0.1 % EX CREA: 1.0000 | TOPICAL_CREAM | Freq: Two times a day (BID) | CUTANEOUS | 0 refills | Status: DC

## 2022-11-21 MED ORDER — PREDNISONE 50 MG PO TABS: 50.0000 mg | ORAL_TABLET | Freq: Every day | ORAL | 0 refills | Status: DC

## 2022-11-21 NOTE — ED Provider Notes (Signed)
[facility]    CSN: [id number] Arrival date & time: 11/21/22  1502      History   Chief Complaint Chief Complaint  Patient presents with   Rash    HPI Marissa [name] is a 42 y.o. female.   42 yr old female who presents to urgent care with a worsening rash on the right leg, right thigh, right forearm and right side of face. She also notes an itching area on her tongue. This started about 5 days ago. She denies any new soaps, detergents, medications, products or other exposures. She has had the same type of rash about 2 years ago and reports that the treatment they did then worked well. She does relate a headache but reports she gets headaches and has taken tylenol which helps. She denies fevers, chills, cough, abdominal pain or other symptoms.    The history is provided by the patient.  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   Abdominal pain 07/13/2017   Gastroesophageal reflux disease without esophagitis 07/13/2017   Abnormal cervical Papanicolaou smear 12/22/2015   Abnormal uterine bleeding (AUB) 12/18/2015   Constipation 12/02/2015   Weight gain 09/26/2013   Amenorrhea 09/26/2013   Abdominal pain, left lower quadrant 09/26/2013    Past Surgical History:  Procedure Laterality Date   DILATATION & CURETTAGE/HYSTEROSCOPY WITH MYOSURE N/A 02/16/2021   Procedure: DILATATION & CURETTAGE/HYSTEROSCOPY WITH MYOSURE;  Surgeon: Romualdo Bolk, MD;  Location: [facility];  Service: Gynecology;  Laterality: N/A;   STERILIZATION  2013   BTL    OB History      Gravida  5   Para  4   Term  4   Preterm  0   AB  1   Living         SAB  1   IAB  0   Ectopic  0   Multiple      Live Births           Obstetric Comments  All vaginal          Home Medications    Prior to Admission medications   Medication Sig Start Date End Date Taking? Authorizing Provider  cyclobenzaprine (FLEXERIL) 5 MG tablet Take 1 tablet (5 mg total) by mouth 3 (three) times daily as needed for muscle spasms. 05/19/22   Romualdo Bolk, MD  hyoscyamine (LEVSIN SL) 0.125 MG SL tablet Place under the tongue 2 (two) times daily as needed. 05/09/22   [provider]  ibuprofen (ADVIL) 800 MG tablet Take 1 tablet (800 mg total) by mouth every 8 (eight) hours as needed. 05/19/22   Romualdo Bolk, MD  norethindrone (AYGESTIN) 5 MG tablet Take one tablet BID, if bleeding hasn't slowed down by tomorrow then increase to TID. Once bleeding stops go to one tablet a day 10/06/22   Romualdo Bolk, MD    Family History Family History  Problem Relation Age of Onset   Asthma Mother    Hyperlipidemia Mother    Brain cancer Father  Alzheimer's disease Paternal Grandmother     Social History Social History   Tobacco Use   Smoking status: Former    Current packs/day: 0.00    Types: Cigarettes    Start date: 2004    Quit date: 2005    Years since quitting: 19.6   Smokeless tobacco: Never   Tobacco comments:    smoked for 1 year over 3 years ago  Vaping Use   Vaping status: Never Used  Substance Use Topics   Alcohol use: No   Drug use: No     Allergies   Patient has no known allergies.   Review of Systems Review of Systems  Constitutional:  Negative for activity change and fever.  Eyes:  Negative for pain and visual disturbance.  Respiratory:  Negative for cough and shortness of breath.   Cardiovascular:  Negative for chest pain.  Gastrointestinal:  Negative for abdominal pain and diarrhea.  Musculoskeletal:  Negative for  back pain and gait problem.  Skin:  Positive for rash.       Itching  Neurological:  Positive for headaches. Negative for dizziness.  Psychiatric/Behavioral:  The patient is not nervous/anxious.      Physical Exam Triage Vital Signs ED Triage Vitals  Encounter Vitals Group     BP 11/21/22 1543 (!) 149/86     Systolic BP Percentile --      Diastolic BP Percentile --      Pulse Rate 11/21/22 1543 73     Resp 11/21/22 1543 16     Temp 11/21/22 1543 98.3 F (36.8 C)     Temp Source 11/21/22 1543 Oral     SpO2 11/21/22 1543 99 %     Weight --      Height --      Head Circumference --      Peak Flow --      Pain Score 11/21/22 1541 0     Pain Loc --      Pain Education --      Exclude from Growth Chart --    No data found.  Updated Vital Signs BP (!) 149/86 (BP Location: Right Arm)   Pulse 73   Temp 98.3 F (36.8 C) (Oral)   Resp 16   SpO2 99%   Visual Acuity Right Eye Distance:   Left Eye Distance:   Bilateral Distance:    Right Eye Near:   Left Eye Near:    Bilateral Near:     Physical Exam Constitutional:      Appearance: Normal appearance.  HENT:     Head: Normocephalic.      Mouth/Throat:     Mouth: Mucous membranes are moist.     Pharynx: No posterior oropharyngeal erythema.  Cardiovascular:     Rate and Rhythm: Normal rate and regular rhythm.  Pulmonary:     Effort: Pulmonary effort is normal.     Breath sounds: Normal breath sounds.  Abdominal:     General: Bowel sounds are normal.     Palpations: Abdomen is soft.  Skin:    General: Skin is warm and dry.     Findings: Rash (Red macule type rash on right arm, right thigh, right anterior lower leg) present.  Neurological:     Mental Status: She is alert.      UC Treatments / Results  Labs (all labs ordered are listed, but only abnormal results are displayed) Labs Reviewed - No data to display  EKG   Radiology No  results found.  Procedures Procedures (including critical care  time)  Medications Ordered in UC Medications - No data to display  Initial Impression / Assessment and Plan / UC Course  I have reviewed the triage vital signs and the nursing notes.  Pertinent labs & imaging results that were available during my care of the patient were reviewed by me and considered in my medical decision making (see chart for details).     Urticaria with contact dermatitis: unsure of origin but has had similar in past. Will start steroid given severity for 5 days. Kenalog for itching. If this does not improve or worsens, then should return to urgent care. If becomes more frequent, then may want to consider referral to dermatology for evaluation.  Final Clinical Impressions(s) / UC Diagnoses   Final diagnoses:  None   Discharge Instructions   None    ED Prescriptions   None    PDMP not reviewed this encounter.   Landis Martins, [place] 11/21/22 [phone]

## 2022-11-21 NOTE — Discharge Instructions (Signed)
Start Prednisone 50mg  daily for 5 days and may use kenalog cream twice daily on arms and legs. Do not apply kenalog to face. Use kenalog for up to 2 weeks. Follow up with urgent care or PCP if worsens or fails to improve

## 2022-11-21 NOTE — ED Triage Notes (Signed)
Atient presents to [facility] for evaluation of rash to right side of body, forarms, whole right leg, and she feels like she has a rash in her throat (itchy).

## 2023-01-12 ENCOUNTER — Ambulatory Visit: Payer: Self-pay

## 2023-03-02 ENCOUNTER — Encounter: Payer: Self-pay | Admitting: Hematology and Oncology

## 2023-03-02 ENCOUNTER — Ambulatory Visit: Payer: Self-pay | Admitting: Hematology and Oncology

## 2023-03-02 VITALS — BP 133/85 | Wt 201.4 lb

## 2023-03-02 DIAGNOSIS — Z1231 Encounter for screening mammogram for malignant neoplasm of breast: Secondary | ICD-10-CM

## 2023-03-02 NOTE — Patient Instructions (Signed)
Taught Marissa [name] about self breast awareness and gave educational materials to take home. Patient did not need a Pap smear today due to last Pap smear was in 12/21/2021 per patient. Let her know [facility] will cover Pap smears every  years unless has a history of abnormal Pap smears. Referred patient to the [facility] for screening mammogram. Appointment scheduled for 03/02/2023. Patient aware of appointment and will be there. Let patient know will follow up with her within the next couple weeks with results. Marissa [name] verbalized understanding.  Pascal Lux, NP 11:09 AM

## 2023-03-02 NOTE — Progress Notes (Signed)
Ms. Marissa Marissa [name] is Marissa [name] 42 y.o. female who presents to [facility] clinic today with no complaints.    Pap Smear: Pap not smear completed today. Last Pap smear was 12/21/2021 and was normal. Per patient has no history of an abnormal Pap smear. Last Pap smear result is available in Epic.   Physical exam: Breasts Breasts symmetrical. No skin abnormalities bilateral breasts. No nipple retraction bilateral breasts. No nipple discharge bilateral breasts. No lymphadenopathy. No lumps palpated bilateral breasts.       Pelvic/Bimanual Pap is not indicated today    Smoking History: Patient is Marissa [name] former smoker and was not referred to quit line.    Patient Navigation: Patient education provided. Access to services provided for patient through [facility] program. Marissa Marissa [name] interpreter provided. No transportation provided   Colorectal Cancer Screening: Per patient has never had colonoscopy completed No complaints today.    Breast and Cervical Cancer Risk Assessment: Patient does not have family history of breast cancer, known genetic mutations, or radiation treatment to the chest before age 19. Patient does not have history of cervical dysplasia, immunocompromised, or DES exposure in-utero.  Risk Scores as of Encounter on 03/02/2023     Marissa Marissa [name]           5-year 0.54%   Lifetime 8.95%   This patient is [place]/Lat[place] but has no documented birth country, so the [place] model used data from [place] patients to calculate their risk score. Document Marissa [name] birth country in the Demographics activity for Marissa [name] more accurate score.         Last calculated by Marissa Marissa [name], CMA on 03/02/2023 at 11:08 AM        Marissa [name]: [facility] exam without pap smear No complaints with benign exam.   P: Referred patient to the [facility] for Marissa [name] screening mammogram. Appointment scheduled 03/02/2023.  Marissa Basset A, NP 03/02/2023 11:18 AM

## 2023-05-29 ENCOUNTER — Telehealth: Payer: Self-pay

## 2023-05-29 DIAGNOSIS — N939 Abnormal uterine and vaginal bleeding, unspecified: Secondary | ICD-10-CM

## 2023-05-29 MED ORDER — NORETHINDRONE ACETATE 5 MG PO TABS: ORAL_TABLET | ORAL | 0 refills | Status: DC

## 2023-05-29 NOTE — Telephone Encounter (Signed)
 Used interpreter line:  ID [place] # [id number]   LVMTCB.

## 2023-05-29 NOTE — Telephone Encounter (Signed)
-----   Message from Bradley L sent at 05/29/2023  1:24 PM EST ----- Regarding: Refill for generic brand norethindrone  The pt came in the office stating she's completely out of the medicine. We have her setup for an appt on Tuesday 2/18 for abn bleeding. If someone will please call her [phone].  Thank you Mylinda Asa

## 2023-05-29 NOTE — Telephone Encounter (Signed)
 Pt returned call and LVMTCB in triage line.   Used interpreter line:  ID [place] # [id number]   LMP: unknown (09/2022)  Pt reports hx of irregular periods. However, when periods do come, they are strong/heavy and last a while.  Reports this episode started two weeks ago, but is light now due to taking the refill of aygestn bottle that was sent to pharmacy w/ last rx sent in 09/2022. Reported taking BID since picked up rx. But last dose taken was yesterday and bleeding has not stopped.  Pt denies feeling pain/discomfort but does have heaviness/pressure as if passing clots.   Please advise.

## 2023-05-29 NOTE — Telephone Encounter (Signed)
 Refill for Aygestin  sent to the her pharmacy.  Please arrange follow-up for next available.

## 2023-06-02 NOTE — Telephone Encounter (Signed)
Pt scheduled for 2/18. Encounter closed.

## 2023-06-06 ENCOUNTER — Ambulatory Visit (INDEPENDENT_AMBULATORY_CARE_PROVIDER_SITE_OTHER): Payer: Self-pay | Admitting: Obstetrics and Gynecology

## 2023-06-06 ENCOUNTER — Encounter: Payer: Self-pay | Admitting: Obstetrics and Gynecology

## 2023-06-06 DIAGNOSIS — N939 Abnormal uterine and vaginal bleeding, unspecified: Secondary | ICD-10-CM

## 2023-06-06 MED ORDER — NORETHINDRONE ACETATE 5 MG PO TABS: ORAL_TABLET | ORAL | 11 refills | Status: DC

## 2023-06-06 NOTE — Progress Notes (Signed)
 43 y.o. G37P4010 female s/p BTL here for AUB. Due to language barrier, an interpreter was present during the history-taking and subsequent discussion (and for part of the physical exam) with this patient. 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  She is a prior patient of Dr. Oscar La.  W/u for AUB was completed in June 2024. 10/06/22 TVUS normal, CBC nromal, TSH slightly elevated, normal T4 Started aygestin but ran out, now experiencing irregular, prolonged bleeding.  Patient's last menstrual period was 05/22/2023 (approximate).   Just spotting at this time  Birth control: BTL Smoker: former  GYN HISTORY: No significant history  OB History  Gravida Para Term Preterm AB Living  5 4 4  0 1   SAB IAB Ectopic Multiple Live Births  1 0 0      # Outcome Date GA Lbr Len/2nd Weight Sex Type Anes PTL Lv  5 SAB           4 Term           3 Term           2 Term           1 Term             Obstetric Comments  All vaginal    Past Medical History:  Diagnosis Date   Abnormal uterine bleeding (AUB)    Anemia 02/08/2021   took iron in past   GERD (gastroesophageal reflux disease)    Wears glasses    for reading    Past Surgical History:  Procedure Laterality Date   DILATATION & CURETTAGE/HYSTEROSCOPY WITH MYOSURE N/A 02/16/2021   Procedure: DILATATION & CURETTAGE/HYSTEROSCOPY WITH MYOSURE;  Surgeon: Romualdo Bolk, MD;  Location: [facility];  Service: Gynecology;  Laterality: N/A;   STERILIZATION  2013   BTL    Current Outpatient Medications on File Prior to Visit  Medication Sig Dispense Refill   ferrous sulfate 324 MG TBEC Take 324 mg by mouth.     Oyster Shell Calcium 500 MG TABS 1 tablet with meals Orally Twice a day     No current facility-administered medications on file prior to visit.    No Known Allergies    PE Today's Vitals   06/06/23 1037  BP: 124/72  Pulse: 80  Temp: 98 F (36.7 C)  TempSrc: Oral  SpO2: 97%  Weight: 210 lb  (95.3 kg)   Body mass index is 38.41 kg/m.  Physical Exam Vitals reviewed.  Constitutional:      General: She is not in acute distress.    Appearance: Normal appearance.  HENT:     Head: Normocephalic and atraumatic.     Nose: Nose normal.  Eyes:     Extraocular Movements: Extraocular movements intact.     Conjunctiva/sclera: Conjunctivae normal.  Pulmonary:     Effort: Pulmonary effort is normal.  Musculoskeletal:        General: Normal range of motion.     Cervical back: Normal range of motion.  Neurological:     General: No focal deficit present.     Mental Status: She is alert.  Psychiatric:        Mood and Affect: Mood normal.        Behavior: Behavior normal.       Assessment and Plan:        Abnormal uterine bleeding (AUB) -     Norethindrone Acetate; Take 1 tablet (5 mg total) by mouth daily for the first 12  days of each month.  Dispense: 12 tablet; Refill: 11    Hormonal work-up, [place] completed within past year First line therapies reviewed, including hormonal therapy and tranexamic acid. Also discussed endometrial ablation, uterine artery embolization and hysterectomy.  Start cyclical aygestin Pamphlets for Eab and hysterectomy provided RTO in 6 months or sooner if needed  Marissa Gess, MD

## 2023-09-13 ENCOUNTER — Ambulatory Visit: Payer: Self-pay | Admitting: Surgery

## 2023-09-28 ENCOUNTER — Telehealth: Payer: Self-pay

## 2023-09-28 DIAGNOSIS — N939 Abnormal uterine and vaginal bleeding, unspecified: Secondary | ICD-10-CM

## 2023-09-28 MED ORDER — NORETHINDRONE ACETATE 5 MG PO TABS: 10.0000 mg | ORAL_TABLET | Freq: Every day | ORAL | 0 refills | Status: DC

## 2023-09-28 NOTE — Telephone Encounter (Signed)
 Patient called and ask if she could have a refill on the Aygestin  5mg . She states that she is taking 2 a day to stop the bleeding and she is going to run out of medication. Please advise.

## 2023-09-28 NOTE — Telephone Encounter (Signed)
 Prescription sent for Aygestin  10 mg daily for 2 weeks.  Patient needs to present for endometrial biopsy given poorly controlled bleeding.  Please schedule.

## 2023-09-28 NOTE — Telephone Encounter (Signed)
 Voice mail left for patient with help from interpreter [phone] to let her know that prescription was sent to her pharmacy. Also advised patient to call and schedule Endo bx. Orders have been placed.

## 2023-10-03 ENCOUNTER — Encounter: Payer: Self-pay | Admitting: Obstetrics and Gynecology

## 2023-10-03 ENCOUNTER — Other Ambulatory Visit (HOSPITAL_COMMUNITY)
Admission: RE | Admit: 2023-10-03 | Discharge: 2023-10-03 | Disposition: A | Discharge status: Home / Self Care | Payer: Self-pay | Source: Ambulatory Visit | Attending: Obstetrics and Gynecology | Admitting: Obstetrics and Gynecology

## 2023-10-03 ENCOUNTER — Ambulatory Visit (INDEPENDENT_AMBULATORY_CARE_PROVIDER_SITE_OTHER): Payer: Self-pay | Admitting: Obstetrics and Gynecology

## 2023-10-03 VITALS — BP 112/74 | HR 84 | Temp 98.0°F | Wt 213.0 lb

## 2023-10-03 DIAGNOSIS — R350 Frequency of micturition: Secondary | ICD-10-CM

## 2023-10-03 DIAGNOSIS — N939 Abnormal uterine and vaginal bleeding, unspecified: Secondary | ICD-10-CM | POA: Insufficient documentation

## 2023-10-03 DIAGNOSIS — Z01812 Encounter for preprocedural laboratory examination: Secondary | ICD-10-CM

## 2023-10-03 MED ORDER — NORETHINDRONE ACETATE 5 MG PO TABS
5.0000 mg | ORAL_TABLET | Freq: Every day | ORAL | 3 refills | Status: AC
Start: 2023-10-03 — End: ?

## 2023-10-03 NOTE — Patient Instructions (Signed)
 It is common to have vaginal bleeding and cramping for up to 72 hours after your biopsy. Please call our office with heavy vaginal bleeding, severe abdominal pain or fever. Avoid intercourse, tampon use, douching and baths for 7 days to decrease the risk of infection.

## 2023-10-03 NOTE — Progress Notes (Signed)
 43 y.o. G85P4010 female s/p BTL with AUB on aygestin  here for EMB. Due to language barrier, an interpreter was present during the history-taking and subsequent discussion (and for part of the physical exam) with this patient. 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  Patient's last menstrual period was 09/18/2023 (approximate).   She is a prior patient of Dr. Jertson. W/u for AUB was completed in June 2024. 10/06/22 TVUS normal, CBC normal, TSH slightly elevated, normal T4 10/06/22 EMB benign Managed with cyclical aygestin , started requiring 10mg  every day  to control bleeding in June.  Was previously on 20mcg COC, curious if this would be helpful.  Birth control: BTL Smoker: former  GYN HISTORY: No significant history  OB History  Gravida Para Term Preterm AB Living  5 4 4  0 1   SAB IAB Ectopic Multiple Live Births  1 0 0      # Outcome Date GA Lbr Len/2nd Weight Sex Type Anes PTL Lv  5 SAB           4 Term           3 Term           2 Term           1 Term             Obstetric Comments  All vaginal    Past Medical History:  Diagnosis Date   Abnormal uterine bleeding (AUB)    Anemia 02/08/2021   took iron in past   GERD (gastroesophageal reflux disease)    Wears glasses    for reading    Past Surgical History:  Procedure Laterality Date   DILATATION & CURETTAGE/HYSTEROSCOPY WITH MYOSURE N/A 02/16/2021   Procedure: DILATATION & CURETTAGE/HYSTEROSCOPY WITH MYOSURE;  Surgeon: Wanita Gutta, MD;  Location: [facility] [facility];  Service: Gynecology;  Laterality: N/A;   STERILIZATION  2013   BTL    Current Outpatient Medications on File Prior to Visit  Medication Sig Dispense Refill   ferrous sulfate 324 MG TBEC Take 324 mg by mouth.     Oyster Shell Calcium  500 MG TABS 1 tablet with meals Orally Twice a day     pantoprazole (PROTONIX) 40 MG tablet Take 40 mg by mouth every morning.     tirzepatide (ZEPBOUND) 2.5 MG/0.5ML Pen Inject 2.5 mg into the  skin once a week.     No current facility-administered medications on file prior to visit.    No Known Allergies    PE Today's Vitals   10/03/23 1411  BP: 112/74  Pulse: 84  Temp: 98 F (36.7 C)  TempSrc: Oral  SpO2: 99%  Weight: 213 lb (96.6 kg)   Body mass index is 38.96 kg/m.  Physical Exam Vitals reviewed.  Constitutional:      General: She is not in acute distress.    Appearance: Normal appearance.  HENT:     Head: Normocephalic and atraumatic.     Nose: Nose normal.   Eyes:     Extraocular Movements: Extraocular movements intact.     Conjunctiva/sclera: Conjunctivae normal.   Pulmonary:     Effort: Pulmonary effort is normal.   Musculoskeletal:        General: Normal range of motion.     Cervical back: Normal range of motion.   Neurological:     General: No focal deficit present.     Mental Status: She is alert.   Psychiatric:  Mood and Affect: Mood normal.        Behavior: Behavior normal.     Procedure Consent was signed. Timeout was performed. Speculum inserted into the vagina, cervix visualized and was prepped with Betadine.  Cervical block was declined. The 3 mm pipelle was introduced into the endometrial cavity without difficulty to a depth of 8cm, suction initiated and a moderate amount of tissue was obtained and sent to pathology.  The instruments were removed from the patient's vagina.  Minimal bleeding from the cervix was noted.  The patient tolerated the procedure well.     Assessment and Plan:        Pre-procedure lab exam -     Pregnancy, urine  Abnormal uterine bleeding (AUB) -     Endometrial biopsy -     Surgical pathology -     Norethindrone  Acetate; Take 1 tablet (5 mg total) by mouth daily.  Dispense: 90 tablet; Refill: 3  Frequency of urination -     Urinalysis,Complete w/RFL Culture   Discussed starting QD aygestin . Continue 10mg  every day for 5d then 5mg  every day. RTO for annual Interested in cost of EAB  and hysterectomy. Directed to front desk  Romaine Closs, MD

## 2023-10-05 LAB — URINALYSIS, COMPLETE W/RFL CULTURE
Bilirubin Urine: NEGATIVE
Glucose, UA: NEGATIVE
Hyaline Cast: NONE SEEN /LPF
Ketones, ur: NEGATIVE
Nitrites, Initial: NEGATIVE
RBC / HPF: >=60 /HPF — AB (ref 0–2)
Specific Gravity, Urine: 1.015 (ref 1.001–1.035)
pH: 7 (ref 5.0–8.0)

## 2023-10-05 LAB — PREGNANCY, URINE: Preg Test, Ur: NEGATIVE

## 2023-10-05 LAB — URINE CULTURE
MICRO NUMBER:: 16590171
Result:: NO GROWTH
SPECIMEN QUALITY:: ADEQUATE

## 2023-10-05 LAB — SURGICAL PATHOLOGY

## 2023-10-05 LAB — CULTURE INDICATED

## 2023-10-06 ENCOUNTER — Ambulatory Visit: Payer: Self-pay | Admitting: Obstetrics and Gynecology

## 2023-10-17 ENCOUNTER — Other Ambulatory Visit: Payer: Self-pay | Admitting: Obstetrics and Gynecology

## 2023-10-17 DIAGNOSIS — N939 Abnormal uterine and vaginal bleeding, unspecified: Secondary | ICD-10-CM

## 2023-10-17 NOTE — Telephone Encounter (Signed)
 Medication refill request: aygestin  5mg  Last AEX:  12-21-21 Last OV: 10-03-23 Next AEX: 01-02-24 Last MMG (if hormonal medication request): 03-02-23 birads 1 Neg Refill authorized: this rx denied. Rx was sent 10-03-23. I placed a pharmacy comment that they can transfer rx if pt desires to

## 2023-10-18 ENCOUNTER — Telehealth: Payer: Self-pay

## 2023-10-18 NOTE — Telephone Encounter (Signed)
 Patient LM on RF line, wanting to make sure her prescription for Norethindrone  5 mg was sent to [facility] [id number] instead of [facility].  Verified RX sent to [facility].  Patient notified.

## 2023-12-04 ENCOUNTER — Ambulatory Visit: Payer: Self-pay | Admitting: Obstetrics and Gynecology

## 2024-01-02 ENCOUNTER — Ambulatory Visit: Payer: Self-pay | Admitting: Obstetrics and Gynecology
